# Patient Record
Sex: Female | Born: 1955 | Race: Black or African American | Hispanic: No | Marital: Married | Smoking: Never smoker
Health system: Southern US, Community
[De-identification: ages and names within clinical notes are randomized; demographics above are authoritative.]

## PROBLEM LIST (undated history)

## (undated) DIAGNOSIS — H269 Unspecified cataract: Secondary | ICD-10-CM

## (undated) HISTORY — DX: Unspecified cataract: H26.9

## (undated) HISTORY — PX: EYE SURGERY: SHX253

## (undated) HISTORY — PX: JOINT REPLACEMENT: SHX530

## (undated) HISTORY — PX: DILATION AND CURETTAGE OF UTERUS: SHX78

---

## 1997-06-06 HISTORY — PX: BREAST BIOPSY: SHX20

## 1999-03-09 ENCOUNTER — Other Ambulatory Visit: Admission: RE | Admit: 1999-03-09 | Discharge: 1999-03-09 | Payer: Self-pay | Admitting: *Deleted

## 2004-09-29 ENCOUNTER — Ambulatory Visit: Payer: Self-pay

## 2005-11-08 ENCOUNTER — Ambulatory Visit: Payer: Self-pay

## 2005-11-15 ENCOUNTER — Ambulatory Visit: Payer: Self-pay

## 2006-11-20 ENCOUNTER — Ambulatory Visit: Payer: Self-pay

## 2007-11-21 ENCOUNTER — Ambulatory Visit: Payer: Self-pay

## 2009-02-19 ENCOUNTER — Ambulatory Visit: Payer: Self-pay

## 2010-03-18 ENCOUNTER — Ambulatory Visit: Payer: Self-pay

## 2011-05-03 ENCOUNTER — Ambulatory Visit: Payer: Self-pay

## 2011-06-14 ENCOUNTER — Encounter: Payer: Self-pay | Admitting: Sports Medicine

## 2011-07-08 ENCOUNTER — Encounter: Payer: Self-pay | Admitting: Sports Medicine

## 2011-08-05 ENCOUNTER — Encounter: Payer: Self-pay | Admitting: Sports Medicine

## 2011-09-05 ENCOUNTER — Encounter: Payer: Self-pay | Admitting: Sports Medicine

## 2011-10-05 ENCOUNTER — Encounter: Payer: Self-pay | Admitting: Sports Medicine

## 2011-10-07 ENCOUNTER — Ambulatory Visit: Payer: Self-pay | Admitting: Sports Medicine

## 2016-04-04 ENCOUNTER — Other Ambulatory Visit: Payer: Self-pay | Admitting: Obstetrics and Gynecology

## 2016-05-05 ENCOUNTER — Other Ambulatory Visit: Payer: Self-pay | Admitting: Obstetrics and Gynecology

## 2016-05-05 DIAGNOSIS — Z1231 Encounter for screening mammogram for malignant neoplasm of breast: Secondary | ICD-10-CM

## 2016-05-17 ENCOUNTER — Ambulatory Visit: Payer: Self-pay

## 2017-02-16 ENCOUNTER — Ambulatory Visit (INDEPENDENT_AMBULATORY_CARE_PROVIDER_SITE_OTHER): Payer: 59 | Admitting: Family Medicine

## 2017-02-16 ENCOUNTER — Encounter: Payer: Self-pay | Admitting: Family Medicine

## 2017-02-16 VITALS — BP 106/66 | HR 84 | Temp 98.7°F | Ht 68.3 in | Wt 159.8 lb

## 2017-02-16 DIAGNOSIS — M25551 Pain in right hip: Secondary | ICD-10-CM

## 2017-02-16 DIAGNOSIS — Z7689 Persons encountering health services in other specified circumstances: Secondary | ICD-10-CM

## 2017-02-16 DIAGNOSIS — H269 Unspecified cataract: Secondary | ICD-10-CM

## 2017-02-16 MED ORDER — DICLOFENAC SODIUM 1 % TD GEL: 2.0000 g | Freq: Four times a day (QID) | TRANSDERMAL | 2 refills | Status: DC

## 2017-02-16 NOTE — Progress Notes (Signed)
BP 106/66   Pulse 84   Temp 98.7 F (37.1 C)   Ht 5' 8.3" (1.735 m)   Wt 159 lb 12.8 oz (72.5 kg)   LMP  (LMP Unknown)   SpO2 98%   BMI 24.08 kg/m    Subjective:    Patient ID: Yolanda Rodgers, female    DOB: August 02, 1955, 61 y.o.   MRN: [id number]  HPI: Yolanda Rodgers is a 61 y.o. female  Chief Complaint  Patient presents with  . Establish Care    pt states she has a nerve in her right hip that catches every now and then    Patient presents today to establish care. No known medical conditions other than cataracts, for which sh is followed by Opthalmology and will soon be having surgery for. No concerns today other than some right hip "catching" and pain on occasion. Occurs about once every week or so, usually lasts less than a day. No noticed pattern of causation. Uses muscle rubs, aleve, and tylenol with good relief.   Past Medical History:  Diagnosis Date  . Cataract    Social History   Social History  . Marital status: Married    Spouse name: N/A  . Number of children: N/A  . Years of education: N/A   Occupational History  . Not on file.   Social History Main Topics  . Smoking status: Never Smoker  . Smokeless tobacco: Never Used  . Alcohol use Yes     Comment: on occasion  . Drug use: No  . Sexual activity: No   Other Topics Concern  . Not on file   Social History Narrative  . No narrative on file    Relevant past medical, surgical, family and social history reviewed and updated as indicated. Interim medical history since our last visit reviewed. Allergies and medications reviewed and updated.  Review of Systems  Constitutional: Negative.   HENT: Negative.   Eyes: Positive for visual disturbance (cataracts).  Respiratory: Negative.   Cardiovascular: Negative.   Gastrointestinal: Negative.   Musculoskeletal: Positive for arthralgias.  Neurological: Negative.   Psychiatric/Behavioral: Negative.    Per HPI unless specifically indicated above       Objective:    BP 106/66   Pulse 84   Temp 98.7 F (37.1 C)   Ht 5' 8.3" (1.735 m)   Wt 159 lb 12.8 oz (72.5 kg)   LMP  (LMP Unknown)   SpO2 98%   BMI 24.08 kg/m   Wt Readings from Last 3 Encounters:  02/16/17 159 lb 12.8 oz (72.5 kg)    Physical Exam  Constitutional: She is oriented to person, place, and time. She appears well-developed and well-nourished. No distress.  Eyes: Conjunctivae are normal.  Neck: Normal range of motion. Neck supple.  Cardiovascular: Normal rate and normal heart sounds.   Pulmonary/Chest: Effort normal and breath sounds normal. No respiratory distress.  Musculoskeletal: Normal range of motion. She exhibits no edema, tenderness or deformity.  Neurological: She is alert and oriented to person, place, and time.  Skin: Skin is warm and dry.  Psychiatric: She has a normal mood and affect. Her behavior is normal.  Nursing note and vitals reviewed.  No results found for this or any previous visit.    Assessment & Plan:   Problem List Items Addressed This Visit      Other   Cataract    Pending surgical repair. Continue per Opthalmology       Other Visit Diagnoses  Encounter to establish care    -  Primary   Right hip pain       Suspect arthritic. Pt wishes to hold off on imaging for now. Recommended swimming and yoga. Diclofenac gel sent, continue tylenol and soaks prn      Follow up plan: Return for CPE.

## 2017-02-17 DIAGNOSIS — H269 Unspecified cataract: Secondary | ICD-10-CM | POA: Insufficient documentation

## 2017-02-17 NOTE — Assessment & Plan Note (Signed)
Pending surgical repair. Continue per Opthalmology

## 2017-02-17 NOTE — Patient Instructions (Signed)
Follow up as needed

## 2017-02-23 ENCOUNTER — Ambulatory Visit (INDEPENDENT_AMBULATORY_CARE_PROVIDER_SITE_OTHER): Payer: 59 | Admitting: Family Medicine

## 2017-02-23 ENCOUNTER — Encounter: Payer: Self-pay | Admitting: Family Medicine

## 2017-02-23 VITALS — BP 105/67 | HR 71 | Temp 98.5°F | Ht 68.3 in | Wt 155.0 lb

## 2017-02-23 DIAGNOSIS — Z0000 Encounter for general adult medical examination without abnormal findings: Secondary | ICD-10-CM

## 2017-02-23 DIAGNOSIS — Z23 Encounter for immunization: Secondary | ICD-10-CM

## 2017-02-23 DIAGNOSIS — Z1159 Encounter for screening for other viral diseases: Secondary | ICD-10-CM

## 2017-02-23 DIAGNOSIS — Z114 Encounter for screening for human immunodeficiency virus [HIV]: Secondary | ICD-10-CM | POA: Diagnosis not present

## 2017-02-23 LAB — UA/M W/RFLX CULTURE, ROUTINE
Bilirubin, UA: NEGATIVE
Glucose, UA: NEGATIVE
Ketones, UA: NEGATIVE
LEUKOCYTES UA: NEGATIVE
Nitrite, UA: NEGATIVE
PH UA: 6.5 (ref 5.0–7.5)
PROTEIN UA: NEGATIVE
RBC UA: NEGATIVE
Specific Gravity, UA: 1.015 (ref 1.005–1.030)
Urobilinogen, Ur: 0.2 mg/dL (ref 0.2–1.0)

## 2017-02-23 LAB — MICROSCOPIC EXAMINATION
Bacteria, UA: NONE SEEN
RBC, UA: NONE SEEN /hpf (ref 0–?)

## 2017-02-24 ENCOUNTER — Telehealth: Payer: Self-pay | Admitting: Family Medicine

## 2017-02-24 LAB — CBC WITH DIFFERENTIAL/PLATELET
BASOS ABS: 0.1 10*3/uL (ref 0.0–0.2)
Basos: 2 %
EOS (ABSOLUTE): 0.3 10*3/uL (ref 0.0–0.4)
EOS: 6 %
Hematocrit: 35.7 % (ref 34.0–46.6)
Hemoglobin: 11.8 g/dL (ref 11.1–15.9)
IMMATURE GRANULOCYTES: 0 %
Immature Grans (Abs): 0 10*3/uL (ref 0.0–0.1)
Lymphocytes Absolute: 2.3 10*3/uL (ref 0.7–3.1)
Lymphs: 51 %
MCH: 28.9 pg (ref 26.6–33.0)
MCHC: 33.1 g/dL (ref 31.5–35.7)
MCV: 87 fL (ref 79–97)
MONOS ABS: 0.3 10*3/uL (ref 0.1–0.9)
Monocytes: 6 %
NEUTROS PCT: 35 %
Neutrophils Absolute: 1.6 10*3/uL (ref 1.4–7.0)
PLATELETS: 350 10*3/uL (ref 150–379)
RBC: 4.09 x10E6/uL (ref 3.77–5.28)
RDW: 14.3 % (ref 12.3–15.4)
WBC: 4.4 10*3/uL (ref 3.4–10.8)

## 2017-02-24 LAB — COMPREHENSIVE METABOLIC PANEL
ALK PHOS: 57 IU/L (ref 39–117)
ALT: 10 IU/L (ref 0–32)
AST: 17 IU/L (ref 0–40)
Albumin/Globulin Ratio: 1.6 (ref 1.2–2.2)
Albumin: 4.6 g/dL (ref 3.5–5.5)
BUN/Creatinine Ratio: 17 (ref 9–23)
BUN: 12 mg/dL (ref 6–24)
Bilirubin Total: 0.5 mg/dL (ref 0.0–1.2)
CALCIUM: 9.8 mg/dL (ref 8.7–10.2)
CO2: 24 mmol/L (ref 20–29)
CREATININE: 0.72 mg/dL (ref 0.57–1.00)
Chloride: 104 mmol/L (ref 96–106)
GFR calc Af Amer: 106 mL/min/{1.73_m2} (ref >59)
GFR, EST NON AFRICAN AMERICAN: 92 mL/min/{1.73_m2} (ref >59)
GLUCOSE: 83 mg/dL (ref 65–99)
Globulin, Total: 2.9 g/dL (ref 1.5–4.5)
Potassium: 4 mmol/L (ref 3.5–5.2)
SODIUM: 141 mmol/L (ref 134–144)
Total Protein: 7.5 g/dL (ref 6.0–8.5)

## 2017-02-24 LAB — HEPATITIS C ANTIBODY: Hep C Virus Ab: <0.1 s/co ratio (ref 0.0–0.9)

## 2017-02-24 LAB — LIPID PANEL W/O CHOL/HDL RATIO
CHOLESTEROL TOTAL: 236 mg/dL — AB (ref 100–199)
HDL: 61 mg/dL (ref >39)
LDL CALC: 165 mg/dL — AB (ref 0–99)
TRIGLYCERIDES: 52 mg/dL (ref 0–149)
VLDL Cholesterol Cal: 10 mg/dL (ref 5–40)

## 2017-02-24 LAB — HIV ANTIBODY (ROUTINE TESTING W REFLEX): HIV Screen 4th Generation wRfx: NONREACTIVE

## 2017-02-24 LAB — TSH: TSH: 1.52 u[IU]/mL (ref 0.450–4.500)

## 2017-02-24 NOTE — Telephone Encounter (Signed)
Left message to call.

## 2017-02-24 NOTE — Telephone Encounter (Signed)
Please call pt and let her know all of her labs came back normal except her bad cholesterol was elevated. If she would like to start a statin, that is reasonable here but she can also try red yeast rice and/or fish oil supplements and recheck in 6 months - let me know! Either way, we will see her back in 6 months for recheck

## 2017-02-24 NOTE — Telephone Encounter (Signed)
Patient notified, she wants to try the supplements first.

## 2017-02-26 NOTE — Patient Instructions (Signed)
Follow up in 1 year.

## 2017-02-26 NOTE — Progress Notes (Signed)
BP 105/67   Pulse 71   Temp 98.5 F (36.9 C)   Ht 5' 8.3" (1.735 m)   Wt 155 lb (70.3 kg)   LMP  (LMP Unknown)   SpO2 100%   BMI 23.36 kg/m    Subjective:    Patient ID: Yolanda Rodgers, female    DOB: 1955/10/23, 61 y.o.   MRN: [id number]  HPI: Yolanda Rodgers is a 61 y.o. female presenting on 02/23/2017 for comprehensive medical examination. Current medical complaints include:none  Does not want a Colonoscopy, interested in a non-invasive type of screening at some point but not right now.  Menopausal Symptoms: no  Depression Screen done today and results listed below:  Depression screen [facility] 2/9 02/16/2017  Decreased Interest 1  Down, Depressed, Hopeless 2  PHQ - 2 Score 3  Altered sleeping 1  Tired, decreased energy 0  Change in appetite 0  Feeling bad or failure about yourself  1  Trouble concentrating 0  Moving slowly or fidgety/restless 0  Suicidal thoughts 0  PHQ-9 Score 5    The patient does not have a history of falls. I did not complete a risk assessment for falls. A plan of care for falls was not documented.   Past Medical History:  Past Medical History:  Diagnosis Date  . Cataract     Surgical History:  Past Surgical History:  Procedure Laterality Date  . BREAST BIOPSY Right 1999   negative per patient  . DILATION AND CURETTAGE OF UTERUS      Medications:  Current Outpatient Prescriptions on File Prior to Visit  Medication Sig  . Calcium Carbonate-Vitamin D (CALCIUM-VITAMIN D3 PO) Take 1 tablet by mouth daily.  . Cyanocobalamin (VITAMIN B12 PO) Take 1 tablet by mouth daily.  . diclofenac sodium (VOLTAREN) 1 % GEL Apply 2 g topically 4 (four) times daily.  . Glucosamine-Chondroitin (GLUCOSAMINE CHONDR COMPLEX PO) Take 1 tablet by mouth daily.  . Multiple Vitamin (MULTIVITAMIN) tablet Take 1 tablet by mouth daily.  . Nutritional Supplements (ESTROVEN PO) Take 1 tablet by mouth daily.   No current facility-administered medications on file prior to  visit.     Allergies:  No Known Allergies  Social History:  Social History   Social History  . Marital status: Married    Spouse name: N/A  . Number of children: N/A  . Years of education: N/A   Occupational History  . Not on file.   Social History Main Topics  . Smoking status: Never Smoker  . Smokeless tobacco: Never Used  . Alcohol use Yes     Comment: on occasion  . Drug use: No  . Sexual activity: No   Other Topics Concern  . Not on file   Social History Narrative  . No narrative on file   History  Smoking Status  . Never Smoker  Smokeless Tobacco  . Never Used   History  Alcohol Use  . Yes    Comment: on occasion    Family History:  Family History  Problem Relation Age of Onset  . Adopted: Yes    Past medical history, surgical history, medications, allergies, family history and social history reviewed with patient today and changes made to appropriate areas of the chart.   Review of Systems - General ROS: negative Psychological ROS: negative Ophthalmic ROS: positive for - blurred vision from known cataracts, pending surgery ENT ROS: negative Breast ROS: negative for breast lumps Respiratory ROS: no cough, shortness of breath, or wheezing Cardiovascular  ROS: no chest pain or dyspnea on exertion Gastrointestinal ROS: no abdominal pain, change in bowel habits, or black or bloody stools Genito-Urinary ROS: no dysuria, trouble voiding, or hematuria Musculoskeletal ROS: negative Neurological ROS: no TIA or stroke symptoms Dermatological ROS: negative All other ROS negative except what is listed above and in the HPI.      Objective:    BP 105/67   Pulse 71   Temp 98.5 F (36.9 C)   Ht 5' 8.3" (1.735 m)   Wt 155 lb (70.3 kg)   LMP  (LMP Unknown)   SpO2 100%   BMI 23.36 kg/m   Wt Readings from Last 3 Encounters:  02/23/17 155 lb (70.3 kg)  02/16/17 159 lb 12.8 oz (72.5 kg)    Physical Exam  Constitutional: She is oriented to person,  place, and time. She appears well-developed and well-nourished. No distress.  HENT:  Head: Atraumatic.  Right Ear: External ear normal.  Left Ear: External ear normal.  Nose: Nose normal.  Mouth/Throat: Oropharynx is clear and moist. No oropharyngeal exudate.  Eyes: Pupils are equal, round, and reactive to light. Conjunctivae are normal. No scleral icterus.  Neck: Normal range of motion. Neck supple. No thyromegaly present.  Cardiovascular: Normal rate, regular rhythm, normal heart sounds and intact distal pulses.   Pulmonary/Chest: Effort normal and breath sounds normal. No respiratory distress.  Breast exam declined  Abdominal: Soft. Bowel sounds are normal. She exhibits no mass. There is no tenderness.  Musculoskeletal: Normal range of motion. She exhibits no edema or tenderness.  Lymphadenopathy:    She has no cervical adenopathy.  Neurological: She is alert and oriented to person, place, and time. No cranial nerve deficit.  Skin: Skin is warm and dry. No rash noted.  Psychiatric: She has a normal mood and affect. Her behavior is normal.  Nursing note and vitals reviewed.     Assessment & Plan:   Problem List Items Addressed This Visit    None    Visit Diagnoses    Annual physical exam    -  Primary   Fasting labs today. Discussed colon cancer screening options, pt will let us know what she wants to do in the future.    Relevant Orders   CBC with Differential/Platelet (Completed)   Comprehensive metabolic panel (Completed)   Lipid Panel w/o Chol/HDL Ratio (Completed)   TSH (Completed)   UA/M w/rflx Culture, Routine (Completed)   Need for diphtheria-tetanus-pertussis (Tdap) vaccine       Relevant Orders   Tdap vaccine greater than or equal to 7yo IM (Completed)   Need for hepatitis C screening test       Relevant Orders   Hepatitis C Antibody (Completed)   Encounter for screening for HIV       Relevant Orders   HIV antibody (Completed)       Follow up plan: Return  in about 1 year (around 02/23/2018) for CPE, pending normal labs.   LABORATORY TESTING:  - Pap smear: up to date  IMMUNIZATIONS:   - Tdap: Tetanus vaccination status reviewed: Td vaccination indicated and given today. - Influenza: will get at her work soon  SCREENING: -Mammogram: Up to date  - Colonoscopy: Refused   PATIENT COUNSELING:   Advised to take 1 mg of folate supplement per day if capable of pregnancy.   Sexuality: Discussed sexually transmitted diseases, partner selection, use of condoms, avoidance of unintended pregnancy  and contraceptive alternatives.   Advised to avoid cigarette smoking.  I discussed with the patient that most people either abstain from alcohol or drink within safe limits (<=14/week and <=4 drinks/occasion for males, <=7/weeks and <= 3 drinks/occasion for females) and that the risk for alcohol disorders and other health effects rises proportionally with the number of drinks per week and how often a drinker exceeds daily limits.  Discussed cessation/primary prevention of drug use and availability of treatment for abuse.   Diet: Encouraged to adjust caloric intake to maintain  or achieve ideal body weight, to reduce intake of dietary saturated fat and total fat, to limit sodium intake by avoiding high sodium foods and not adding table salt, and to maintain adequate dietary potassium and calcium preferably from fresh fruits, vegetables, and low-fat dairy products.    stressed the importance of regular exercise  Injury prevention: Discussed safety belts, safety helmets, smoke detector, smoking near bedding or upholstery.   Dental health: Discussed importance of regular tooth brushing, flossing, and dental visits.    NEXT PREVENTATIVE PHYSICAL DUE IN 1 YEAR. Return in about 1 year (around 02/23/2018) for CPE, pending normal labs.

## 2017-05-17 ENCOUNTER — Ambulatory Visit: Payer: 59 | Admitting: Family Medicine

## 2017-05-17 ENCOUNTER — Ambulatory Visit: Payer: Self-pay | Admitting: Family Medicine

## 2017-05-19 ENCOUNTER — Ambulatory Visit: Payer: 59 | Admitting: Family Medicine

## 2017-05-19 ENCOUNTER — Encounter: Payer: Self-pay | Admitting: Family Medicine

## 2017-05-19 VITALS — BP 91/57 | HR 86 | Temp 98.1°F | Wt 161.7 lb

## 2017-05-19 DIAGNOSIS — Z1239 Encounter for other screening for malignant neoplasm of breast: Secondary | ICD-10-CM

## 2017-05-19 DIAGNOSIS — Z1231 Encounter for screening mammogram for malignant neoplasm of breast: Secondary | ICD-10-CM

## 2017-05-19 DIAGNOSIS — Z23 Encounter for immunization: Secondary | ICD-10-CM

## 2017-05-19 NOTE — Progress Notes (Signed)
BP (!) 91/57 (BP Location: Left Arm, Patient Position: Sitting, Cuff Size: Normal)   Pulse 86   Temp 98.1 F (36.7 C) (Oral)   Wt 161 lb 11.2 oz (73.3 kg)   SpO2 100%   BMI 24.37 kg/m    Subjective:    Patient ID: Yolanda Rodgers, female    DOB: [birth year], 61 y.o.   MRN: [id number]  HPI: Yolanda Rodgers is a 61 y.o. female  Chief Complaint  Patient presents with  . Mammogram    Wants order for Mammogram   Pt wanting to discuss some health maintenance items today. Wanting order for mammogram as her other one is about to expire and she would also like to have it done at a different location. Due this month.   Also wanting to get her pap smear while she's here if she can. [facility].   Still considering cologuard testing, adamant about no colonoscopy at this time. Not having any GI sxs and wondering how necessary testing is.   Relevant past medical, surgical, family and social history reviewed and updated as indicated. Interim medical history since our last visit reviewed. Allergies and medications reviewed and updated.  Review of Systems  Constitutional: Negative.   Respiratory: Negative.   Cardiovascular: Negative.   Gastrointestinal: Negative.   Musculoskeletal: Negative.   Neurological: Negative.   Psychiatric/Behavioral: Negative.    Per HPI unless specifically indicated above     Objective:    BP (!) 91/57 (BP Location: Left Arm, Patient Position: Sitting, Cuff Size: Normal)   Pulse 86   Temp 98.1 F (36.7 C) (Oral)   Wt 161 lb 11.2 oz (73.3 kg)   SpO2 100%   BMI 24.37 kg/m   Wt Readings from Last 3 Encounters:  05/19/17 161 lb 11.2 oz (73.3 kg)  02/23/17 155 lb (70.3 kg)  02/16/17 159 lb 12.8 oz (72.5 kg)    Physical Exam  Constitutional: She is oriented to person, place, and time. She appears well-developed and well-nourished.  HENT:  Head: Atraumatic.  Eyes: Conjunctivae are normal. Pupils are equal, round, and  reactive to light. No scleral icterus.  Neck: Normal range of motion. Neck supple.  Cardiovascular: Normal rate and normal heart sounds.  Pulmonary/Chest: Effort normal and breath sounds normal.  Musculoskeletal: Normal range of motion.  Neurological: She is alert and oriented to person, place, and time.  Skin: Skin is warm and dry.  Psychiatric: She has a normal mood and affect. Her behavior is normal.  Nursing note and vitals reviewed.   Results for orders placed or performed in visit on 02/23/17  Microscopic Examination  Result Value Ref Range   WBC, UA 0-5 0 - 5 /hpf   RBC, UA None seen 0 - 2 /hpf   Epithelial Cells (non renal) 0-10 0 - 10 /hpf   Bacteria, UA None seen None seen/Few  CBC with Differential/Platelet  Result Value Ref Range   WBC 4.4 3.4 - 10.8 x10E3/uL   RBC 4.09 3.77 - 5.28 x10E6/uL   Hemoglobin 11.8 11.1 - 15.9 g/dL   Hematocrit 01.035.7 27.234.0 - 46.6 %   MCV 87 79 - 97 fL   MCH 28.9 26.6 - 33.0 pg   MCHC 33.1 31.5 - 35.7 g/dL   RDW 53.614.3 64.412.3 - 03.415.4 %   Platelets 350 150 - 379 x10E3/uL   Neutrophils 35 Not Estab. %   Lymphs 51 Not Estab. %   Monocytes 6 Not Estab. %  Eos 6 Not Estab. %   Basos 2 Not Estab. %   Neutrophils Absolute 1.6 1.4 - 7.0 x10E3/uL   Lymphocytes Absolute 2.3 0.7 - 3.1 x10E3/uL   Monocytes Absolute 0.3 0.1 - 0.9 x10E3/uL   EOS (ABSOLUTE) 0.3 0.0 - 0.4 x10E3/uL   Basophils Absolute 0.1 0.0 - 0.2 x10E3/uL   Immature Granulocytes 0 Not Estab. %   Immature Grans (Abs) 0.0 0.0 - 0.1 x10E3/uL  Comprehensive metabolic panel  Result Value Ref Range   Glucose 83 65 - 99 mg/dL   BUN 12 6 - 24 mg/dL   Creatinine, Ser 7.820.72 0.57 - 1.00 mg/dL   GFR calc non Af Amer 92 >59 mL/min/1.73   GFR calc Af Amer 106 >59 mL/min/1.73   BUN/Creatinine Ratio 17 9 - 23   Sodium 141 134 - 144 mmol/L   Potassium 4.0 3.5 - 5.2 mmol/L   Chloride 104 96 - 106 mmol/L   CO2 24 20 - 29 mmol/L   Calcium 9.8 8.7 - 10.2 mg/dL   Total Protein 7.5 6.0 - 8.5 g/dL    Albumin 4.6 3.5 - 5.5 g/dL   Globulin, Total 2.9 1.5 - 4.5 g/dL   Albumin/Globulin Ratio 1.6 1.2 - 2.2   Bilirubin Total 0.5 0.0 - 1.2 mg/dL   Alkaline Phosphatase 57 39 - 117 IU/L   AST 17 0 - 40 IU/L   ALT 10 0 - 32 IU/L  Lipid Panel w/o Chol/HDL Ratio  Result Value Ref Range   Cholesterol, Total 236 (H) 100 - 199 mg/dL   Triglycerides 52 0 - 149 mg/dL   HDL 61 >95>39 mg/dL   VLDL Cholesterol Cal 10 5 - 40 mg/dL   LDL Calculated 621165 (H) 0 - 99 mg/dL  TSH  Result Value Ref Range   TSH 1.520 0.450 - 4.500 uIU/mL  UA/M w/rflx Culture, Routine  Result Value Ref Range   Specific Gravity, UA 1.015 1.005 - 1.030   pH, UA 6.5 5.0 - 7.5   Color, UA Yellow Yellow   Appearance Ur Clear Clear   Leukocytes, UA Negative Negative   Protein, UA Negative Negative/Trace   Glucose, UA Negative Negative   Ketones, UA Negative Negative   RBC, UA Negative Negative   Bilirubin, UA Negative Negative   Urobilinogen, Ur 0.2 0.2 - 1.0 mg/dL   Nitrite, UA Negative Negative   Microscopic Examination See below:   Hepatitis C Antibody  Result Value Ref Range   Hep C Virus Ab <0.1 0.0 - 0.9 s/co ratio  HIV antibody  Result Value Ref Range   HIV Screen 4th Generation wRfx Non Reactive Non Reactive      Assessment & Plan:   Problem List Items Addressed This Visit    None    Visit Diagnoses    Screening for breast cancer    -  Primary   [facility]. pt to call and schedule   Relevant Orders   MM DIGITAL SCREENING BILATERAL   Need for influenza vaccination       Relevant Orders   Flu Vaccine QUAD 6+ mos PF IM (Fluarix Quad PF) (Completed)    Discussed with patient that her pap won't be due for 4 more years as she had a normal result, and that her next pap could be her last pap if normal given age guidelines.  More information given regarding cologuard testing, questions answered and testing encouraged despite being asymptomatic for screening purposes.  Follow up plan: Return for CPE as scheduled.

## 2017-05-22 NOTE — Patient Instructions (Signed)
Follow up for CPE 

## 2017-05-31 LAB — HM MAMMOGRAPHY

## 2018-02-27 ENCOUNTER — Encounter: Payer: 59 | Admitting: Family Medicine

## 2018-03-07 ENCOUNTER — Encounter: Payer: Self-pay | Admitting: Family Medicine

## 2018-03-07 ENCOUNTER — Ambulatory Visit (INDEPENDENT_AMBULATORY_CARE_PROVIDER_SITE_OTHER): Payer: Managed Care, Other (non HMO) | Admitting: Family Medicine

## 2018-03-07 VITALS — BP 104/71 | HR 85 | Ht 68.0 in | Wt 169.5 lb

## 2018-03-07 DIAGNOSIS — Z0000 Encounter for general adult medical examination without abnormal findings: Secondary | ICD-10-CM

## 2018-03-07 DIAGNOSIS — Z1239 Encounter for other screening for malignant neoplasm of breast: Secondary | ICD-10-CM

## 2018-03-07 DIAGNOSIS — Z23 Encounter for immunization: Secondary | ICD-10-CM

## 2018-03-07 LAB — UA/M W/RFLX CULTURE, ROUTINE
Bilirubin, UA: NEGATIVE
GLUCOSE, UA: NEGATIVE
KETONES UA: NEGATIVE
LEUKOCYTES UA: NEGATIVE
Nitrite, UA: NEGATIVE
PROTEIN UA: NEGATIVE
SPEC GRAV UA: 1.02 (ref 1.005–1.030)
Urobilinogen, Ur: 0.2 mg/dL (ref 0.2–1.0)
pH, UA: 6.5 (ref 5.0–7.5)

## 2018-03-07 LAB — MICROSCOPIC EXAMINATION: Bacteria, UA: NONE SEEN

## 2018-03-07 NOTE — Progress Notes (Signed)
BP 104/71   Pulse 85   Ht 5\' 8"  (1.727 m)   Wt 169 lb 8 oz (76.9 kg)   SpO2 96%   BMI 25.77 kg/m    Subjective:    Patient ID: Yolanda Rodgers, female    DOB: Jan 27, 1956, 62 y.o.   MRN: [id number]  HPI: Yolanda Rodgers is a 62 y.o. female presenting on 03/07/2018 for comprehensive medical examination. Current medical complaints include:none  She currently lives with: Menopausal Symptoms: no  Depression Screen done today and results listed below:  Depression screen [facility] 2/9 03/07/2018 02/16/2017  Decreased Interest 0 1  Down, Depressed, Hopeless 1 2  PHQ - 2 Score 1 3  Altered sleeping 1 1  Tired, decreased energy 1 0  Change in appetite 0 0  Feeling bad or failure about yourself  0 1  Trouble concentrating 0 0  Moving slowly or fidgety/restless 0 0  Suicidal thoughts 0 0  PHQ-9 Score 3 5    The patient does not have a history of falls. I did not complete a risk assessment for falls. A plan of care for falls was not documented.   Past Medical History:  Past Medical History:  Diagnosis Date  . Cataract     Surgical History:  Past Surgical History:  Procedure Laterality Date  . BREAST BIOPSY Right 1999   negative per patient  . DILATION AND CURETTAGE OF UTERUS      Medications:  Current Outpatient Medications on File Prior to Visit  Medication Sig  . Calcium Carbonate-Vitamin D (CALCIUM-VITAMIN D3 PO) Take 1 tablet by mouth daily.  . Cyanocobalamin (VITAMIN B12 PO) Take 1 tablet by mouth daily.  . diclofenac sodium (VOLTAREN) 1 % GEL Apply 2 g topically 4 (four) times daily.  . Glucosamine-Chondroitin (GLUCOSAMINE CHONDR COMPLEX PO) Take 1 tablet by mouth daily.  . Multiple Vitamin (MULTIVITAMIN) tablet Take 1 tablet by mouth daily.  . Nutritional Supplements (ESTROVEN PO) Take 1 tablet by mouth daily.  . pilocarpine (PILOCAR) 1 % ophthalmic solution Apply to eye.   No current facility-administered medications on file prior to visit.     Allergies:  No Known  Allergies  Social History:  Social History   Socioeconomic History  . Marital status: Married    Spouse name: Not on file  . Number of children: Not on file  . Years of education: Not on file  . Highest education level: Not on file  Occupational History  . Not on file  Social Needs  . Financial resource strain: Not on file  . Food insecurity:    Worry: Not on file    Inability: Not on file  . Transportation needs:    Medical: Not on file    Non-medical: Not on file  Tobacco Use  . Smoking status: Never Smoker  . Smokeless tobacco: Never Used  Substance and Sexual Activity  . Alcohol use: Yes    Comment: on occasion  . Drug use: No  . Sexual activity: Never  Lifestyle  . Physical activity:    Days per week: Not on file    Minutes per session: Not on file  . Stress: Not on file  Relationships  . Social connections:    Talks on phone: Not on file    Gets together: Not on file    Attends religious service: Not on file    Active member of club or organization: Not on file    Attends meetings of clubs or organizations: Not on  file    Relationship status: Not on file  . Intimate partner violence:    Fear of current or ex partner: Not on file    Emotionally abused: Not on file    Physically abused: Not on file    Forced sexual activity: Not on file  Other Topics Concern  . Not on file  Social History Narrative  . Not on file   Social History   Tobacco Use  Smoking Status Never Smoker  Smokeless Tobacco Never Used   Social History   Substance and Sexual Activity  Alcohol Use Yes   Comment: on occasion    Family History:  Family History  Adopted: Yes    Past medical history, surgical history, medications, allergies, family history and social history reviewed with patient today and changes made to appropriate areas of the chart.   Review of Systems - General ROS: negative Psychological ROS: negative Ophthalmic ROS: negative ENT ROS: negative Allergy  and Immunology ROS: negative Hematological and Lymphatic ROS: negative Endocrine ROS: negative Breast ROS: negative for breast lumps Respiratory ROS: no cough, shortness of breath, or wheezing Cardiovascular ROS: no chest pain or dyspnea on exertion Gastrointestinal ROS: no abdominal pain, change in bowel habits, or black or bloody stools Genito-Urinary ROS: no dysuria, trouble voiding, or hematuria Musculoskeletal ROS: negative Neurological ROS: no TIA or stroke symptoms Dermatological ROS: negative All other ROS negative except what is listed above and in the HPI.      Objective:    BP 104/71   Pulse 85   Ht 5\' 8"  (1.727 m)   Wt 169 lb 8 oz (76.9 kg)   SpO2 96%   BMI 25.77 kg/m   Wt Readings from Last 3 Encounters:  03/07/18 169 lb 8 oz (76.9 kg)  05/19/17 161 lb 11.2 oz (73.3 kg)  02/23/17 155 lb (70.3 kg)    Physical Exam  Constitutional: She is oriented to person, place, and time. She appears well-developed and well-nourished. No distress.  HENT:  Head: Atraumatic.  Right Ear: External ear normal.  Left Ear: External ear normal.  Nose: Nose normal.  Mouth/Throat: Oropharynx is clear and moist. No oropharyngeal exudate.  Eyes: Pupils are equal, round, and reactive to light. Conjunctivae are normal. No scleral icterus.  Neck: Normal range of motion. Neck supple. No thyromegaly present.  Cardiovascular: Normal rate, regular rhythm, normal heart sounds and intact distal pulses.  Pulmonary/Chest: Effort normal and breath sounds normal. No respiratory distress. Right breast exhibits no mass, no skin change and no tenderness. Left breast exhibits no mass, no skin change and no tenderness.  Abdominal: Soft. Bowel sounds are normal. She exhibits no mass. There is no tenderness.  Genitourinary:  Genitourinary Comments: Exam declined today with shared decision making  Musculoskeletal: Normal range of motion. She exhibits no edema or tenderness.  Lymphadenopathy:    She has no  cervical adenopathy.  Neurological: She is alert and oriented to person, place, and time. No cranial nerve deficit.  Skin: Skin is warm and dry. No rash noted.  Psychiatric: She has a normal mood and affect. Her behavior is normal.  Nursing note and vitals reviewed.     Assessment & Plan:   Problem List Items Addressed This Visit    None    Visit Diagnoses    Annual physical exam    -  Primary   Relevant Orders   CBC with Differential/Platelet   Comprehensive metabolic panel   Lipid Panel w/o Chol/HDL Ratio   TSH  UA/M w/rflx Culture, Routine   Screening for breast cancer       Relevant Orders   MM DIGITAL SCREENING BILATERAL       Follow up plan: Return in about 1 year (around 03/08/2019) for CPE.   LABORATORY TESTING:  - Pap smear: up to date  IMMUNIZATIONS:   - Tdap: Tetanus vaccination status reviewed: last tetanus booster within 10 years. - Influenza: Administered today  SCREENING: -Mammogram: Ordered today  - Colonoscopy: Up to date   PATIENT COUNSELING:   Advised to take 1 mg of folate supplement per day if capable of pregnancy.   Sexuality: Discussed sexually transmitted diseases, partner selection, use of condoms, avoidance of unintended pregnancy  and contraceptive alternatives.   Advised to avoid cigarette smoking.  I discussed with the patient that most people either abstain from alcohol or drink within safe limits (<=14/week and <=4 drinks/occasion for males, <=7/weeks and <= 3 drinks/occasion for females) and that the risk for alcohol disorders and other health effects rises proportionally with the number of drinks per week and how often a drinker exceeds daily limits.  Discussed cessation/primary prevention of drug use and availability of treatment for abuse.   Diet: Encouraged to adjust caloric intake to maintain  or achieve ideal body weight, to reduce intake of dietary saturated fat and total fat, to limit sodium intake by avoiding high sodium  foods and not adding table salt, and to maintain adequate dietary potassium and calcium preferably from fresh fruits, vegetables, and low-fat dairy products.    stressed the importance of regular exercise  Injury prevention: Discussed safety belts, safety helmets, smoke detector, smoking near bedding or upholstery.   Dental health: Discussed importance of regular tooth brushing, flossing, and dental visits.    NEXT PREVENTATIVE PHYSICAL DUE IN 1 YEAR. Return in about 1 year (around 03/08/2019) for CPE.

## 2018-03-07 NOTE — Patient Instructions (Addendum)
Follow up for annual exam in 1 yearInfluenza (Flu) Vaccine (Inactivated or Recombinant): What You Need to Know 1. Why get vaccinated? Influenza ("flu") is a contagious disease that spreads around the [place] every year, usually between October and May. Flu is caused by influenza viruses, and is spread mainly by coughing, sneezing, and close contact. Anyone can get flu. Flu strikes suddenly and can last several days. Symptoms vary by age, but can include:  fever/chills  sore throat  muscle aches  fatigue  cough  headache  runny or stuffy nose  Flu can also lead to pneumonia and blood infections, and cause diarrhea and seizures in children. If you have a medical condition, such as heart or lung disease, flu can make it worse. Flu is more dangerous for some people. Infants and young children, people 11 years of age and older, pregnant women, and people with certain health conditions or a weakened immune system are at greatest risk. Each year thousands of people in the [place] die from flu, and many more are hospitalized. Flu vaccine can:  keep you from getting flu,  make flu less severe if you do get it, and  keep you from spreading flu to your family and other people. 2. Inactivated and recombinant flu vaccines A dose of flu vaccine is recommended every flu season. Children 6 months through 11 years of age may need two doses during the same flu season. Everyone else needs only one dose each flu season. Some inactivated flu vaccines contain a very small amount of a mercury-based preservative called thimerosal. Studies have not shown thimerosal in vaccines to be harmful, but flu vaccines that do not contain thimerosal are available. There is no live flu virus in flu shots. They cannot cause the flu. There are many flu viruses, and they are always changing. Each year a new flu vaccine is made to protect against three or four viruses that are likely to cause disease in the  upcoming flu season. But even when the vaccine doesn't exactly match these viruses, it may still provide some protection. Flu vaccine cannot prevent:  flu that is caused by a virus not covered by the vaccine, or  illnesses that look like flu but are not.  It takes about 2 weeks for protection to develop after vaccination, and protection lasts through the flu season. 3. Some people should not get this vaccine Tell the person who is giving you the vaccine:  If you have any severe, life-threatening allergies. If you ever had a life-threatening allergic reaction after a dose of flu vaccine, or have a severe allergy to any part of this vaccine, you may be advised not to get vaccinated. Most, but not all, types of flu vaccine contain a small amount of egg protein.  If you ever had Guillain-Barr Syndrome (also called GBS). Some people with a history of GBS should not get this vaccine. This should be discussed with your doctor.  If you are not feeling well. It is usually okay to get flu vaccine when you have a mild illness, but you might be asked to come back when you feel better.  4. Risks of a vaccine reaction With any medicine, including vaccines, there is a chance of reactions. These are usually mild and go away on their own, but serious reactions are also possible. Most people who get a flu shot do not have any problems with it. Minor problems following a flu shot include:  soreness, redness, or swelling where the  shot was given  hoarseness  sore, red or itchy eyes  cough  fever  aches  headache  itching  fatigue  If these problems occur, they usually begin soon after the shot and last 1 or 2 days. More serious problems following a flu shot can include the following:  There may be a small increased risk of Guillain-Barre Syndrome (GBS) after inactivated flu vaccine. This risk has been estimated at 1 or 2 additional cases per million people vaccinated. This is much lower than  the risk of severe complications from flu, which can be prevented by flu vaccine.  Young children who get the flu shot along with pneumococcal vaccine (PCV13) and/or DTaP vaccine at the same time might be slightly more likely to have a seizure caused by fever. Ask your doctor for more information. Tell your doctor if a child who is getting flu vaccine has ever had a seizure.  Problems that could happen after any injected vaccine:  People sometimes faint after a medical procedure, including vaccination. Sitting or lying down for about 15 minutes can help prevent fainting, and injuries caused by a fall. Tell your doctor if you feel dizzy, or have vision changes or ringing in the ears.  Some people get severe pain in the shoulder and have difficulty moving the arm where a shot was given. This happens very rarely.  Any medication can cause a severe allergic reaction. Such reactions from a vaccine are very rare, estimated at about 1 in a million doses, and would happen within a few minutes to a few hours after the vaccination. As with any medicine, there is a very remote chance of a vaccine causing a serious injury or death. The safety of vaccines is always being monitored. For more information, visit: http://floyd.org/ 5. What if there is a serious reaction? What should I look for? Look for anything that concerns you, such as signs of a severe allergic reaction, very high fever, or unusual behavior. Signs of a severe allergic reaction can include hives, swelling of the face and throat, difficulty breathing, a fast heartbeat, dizziness, and weakness. These would start a few minutes to a few hours after the vaccination. What should I do?  If you think it is a severe allergic reaction or other emergency that can't wait, call 9-1-1 and get the person to the nearest hospital. Otherwise, call your doctor.  Reactions should be reported to the Vaccine Adverse Event Reporting System (VAERS). Your  doctor should file this report, or you can do it yourself through the VAERS web site at www.vaers.LAgents.no, or by calling 1-(339) 026-1235. ? VAERS does not give medical advice. 6. The National Vaccine Injury Compensation Program The [employer] Vaccine Injury Compensation Program (VICP) is a federal program that was created to compensate people who may have been injured by certain vaccines. Persons who believe they may have been injured by a vaccine can learn about the program and about filing a claim by calling 1-805 148 1834 or visiting the VICP website at SpiritualWord.at. There is a time limit to file a claim for compensation. 7. How can I learn more?  Ask your healthcare provider. He or she can give you the vaccine package insert or suggest other sources of information.  Call your local or state health department.  Contact the Centers for Disease Control and Prevention (CDC): ? Call (671)731-2440 (1-800-CDC-INFO) or ? Visit CDC's website at BiotechRoom.com.cy Vaccine Information Statement, Inactivated Influenza Vaccine (01/10/2014) This information is not intended to replace advice given to you  by your health care provider. Make sure you discuss any questions you have with your health care provider. Document Released: 03/17/2006 Document Revised: 02/11/2016 Document Reviewed: 02/11/2016 Elsevier Interactive Patient Education  2017 [employer].

## 2018-03-07 NOTE — Addendum Note (Signed)
Addended by: Sheilah Mins A on: 03/07/2018 11:23 AM   Modules accepted: Orders

## 2018-03-08 LAB — CBC WITH DIFFERENTIAL/PLATELET
BASOS ABS: 0.1 10*3/uL (ref 0.0–0.2)
Basos: 2 %
EOS (ABSOLUTE): 0.2 10*3/uL (ref 0.0–0.4)
Eos: 5 %
Hematocrit: 36.6 % (ref 34.0–46.6)
Hemoglobin: 11.8 g/dL (ref 11.1–15.9)
Immature Grans (Abs): 0 10*3/uL (ref 0.0–0.1)
Immature Granulocytes: 0 %
LYMPHS ABS: 2 10*3/uL (ref 0.7–3.1)
Lymphs: 42 %
MCH: 28.2 pg (ref 26.6–33.0)
MCHC: 32.2 g/dL (ref 31.5–35.7)
MCV: 88 fL (ref 79–97)
MONOS ABS: 0.4 10*3/uL (ref 0.1–0.9)
Monocytes: 8 %
NEUTROS ABS: 2.1 10*3/uL (ref 1.4–7.0)
Neutrophils: 43 %
PLATELETS: 340 10*3/uL (ref 150–450)
RBC: 4.18 x10E6/uL (ref 3.77–5.28)
RDW: 13.1 % (ref 12.3–15.4)
WBC: 4.8 10*3/uL (ref 3.4–10.8)

## 2018-03-08 LAB — COMPREHENSIVE METABOLIC PANEL
A/G RATIO: 1.6 (ref 1.2–2.2)
ALK PHOS: 51 IU/L (ref 39–117)
ALT: 14 IU/L (ref 0–32)
AST: 15 IU/L (ref 0–40)
Albumin: 4.4 g/dL (ref 3.6–4.8)
BUN/Creatinine Ratio: 25 (ref 12–28)
BUN: 19 mg/dL (ref 8–27)
Bilirubin Total: 0.4 mg/dL (ref 0.0–1.2)
CHLORIDE: 102 mmol/L (ref 96–106)
CO2: 27 mmol/L (ref 20–29)
Calcium: 9.7 mg/dL (ref 8.7–10.3)
Creatinine, Ser: 0.77 mg/dL (ref 0.57–1.00)
GFR calc Af Amer: 97 mL/min/{1.73_m2} (ref >59)
GFR calc non Af Amer: 84 mL/min/{1.73_m2} (ref >59)
GLUCOSE: 71 mg/dL (ref 65–99)
Globulin, Total: 2.7 g/dL (ref 1.5–4.5)
POTASSIUM: 4.1 mmol/L (ref 3.5–5.2)
Sodium: 141 mmol/L (ref 134–144)
Total Protein: 7.1 g/dL (ref 6.0–8.5)

## 2018-03-08 LAB — LIPID PANEL W/O CHOL/HDL RATIO
CHOLESTEROL TOTAL: 247 mg/dL — AB (ref 100–199)
HDL: 70 mg/dL (ref >39)
LDL Calculated: 157 mg/dL — ABNORMAL HIGH (ref 0–99)
Triglycerides: 102 mg/dL (ref 0–149)
VLDL CHOLESTEROL CAL: 20 mg/dL (ref 5–40)

## 2018-03-08 LAB — TSH: TSH: 1.41 u[IU]/mL (ref 0.450–4.500)

## 2018-11-23 ENCOUNTER — Encounter: Payer: Self-pay | Admitting: Family Medicine

## 2018-11-23 ENCOUNTER — Ambulatory Visit (INDEPENDENT_AMBULATORY_CARE_PROVIDER_SITE_OTHER): Payer: Managed Care, Other (non HMO) | Admitting: Family Medicine

## 2018-11-23 ENCOUNTER — Other Ambulatory Visit: Payer: Self-pay

## 2018-11-23 VITALS — BP 122/71 | HR 81 | Temp 99.0°F | Ht 68.0 in | Wt 167.0 lb

## 2018-11-23 DIAGNOSIS — Z780 Asymptomatic menopausal state: Secondary | ICD-10-CM | POA: Diagnosis not present

## 2018-11-23 DIAGNOSIS — Z113 Encounter for screening for infections with a predominantly sexual mode of transmission: Secondary | ICD-10-CM

## 2018-11-23 DIAGNOSIS — N898 Other specified noninflammatory disorders of vagina: Secondary | ICD-10-CM | POA: Diagnosis not present

## 2018-11-23 LAB — UA/M W/RFLX CULTURE, ROUTINE
Bilirubin, UA: NEGATIVE
Glucose, UA: NEGATIVE
Ketones, UA: NEGATIVE
Leukocytes,UA: NEGATIVE
Nitrite, UA: NEGATIVE
Protein,UA: NEGATIVE
Specific Gravity, UA: 1.015 (ref 1.005–1.030)
Urobilinogen, Ur: 0.2 mg/dL (ref 0.2–1.0)
pH, UA: 7 (ref 5.0–7.5)

## 2018-11-23 LAB — WET PREP FOR TRICH, YEAST, CLUE
Clue Cell Exam: NEGATIVE
Trichomonas Exam: NEGATIVE
Yeast Exam: NEGATIVE

## 2018-11-23 MED ORDER — ESTRADIOL 0.1 MG/GM VA CREA: 1.0000 | TOPICAL_CREAM | VAGINAL | 12 refills | Status: DC

## 2018-11-23 NOTE — Progress Notes (Signed)
BP 122/71   Pulse 81   Temp 99 F (37.2 C) (Oral)   Ht 5\' 8"  (1.727 m)   Wt 167 lb (75.8 kg)   LMP  (LMP Unknown)   SpO2 98%   BMI 25.39 kg/m    Subjective:    Patient ID: Yolanda Rodgers, female    DOB: [birth year], 63 y.o.   MRN: [id number]  HPI: Yolanda Rodgers is a 63 y.o. female  Chief Complaint  Patient presents with  . Abdominal Pain    lower abdomen since last Saturday  . Vaginal discomfort   Lower abdominal aching and cramping x about a week. Notes sxs started after having intercourse for the first time in about 7 years. Had issues with dryness during this encounter as well as uncomfortable tightness. Denies bleeding, discharge, odor, dysuria, hematuria, fevers, rashes. Has not tried anything OTC. Is also concerned about if it would be possible for her to get pregnant 10 years post-menopause as she's seen cases online.   Relevant past medical, surgical, family and social history reviewed and updated as indicated. Interim medical history since our last visit reviewed. Allergies and medications reviewed and updated.  Review of Systems  Per HPI unless specifically indicated above     Objective:    BP 122/71   Pulse 81   Temp 99 F (37.2 C) (Oral)   Ht 5\' 8"  (1.727 m)   Wt 167 lb (75.8 kg)   LMP  (LMP Unknown)   SpO2 98%   BMI 25.39 kg/m   Wt Readings from Last 3 Encounters:  11/23/18 167 lb (75.8 kg)  03/07/18 169 lb 8 oz (76.9 kg)  05/19/17 161 lb 11.2 oz (73.3 kg)    Physical Exam Vitals signs and nursing note reviewed.  Constitutional:      Appearance: Normal appearance. She is not ill-appearing.  HENT:     Head: Atraumatic.  Eyes:     Extraocular Movements: Extraocular movements intact.     Conjunctiva/sclera: Conjunctivae normal.  Neck:     Musculoskeletal: Normal range of motion and neck supple.  Cardiovascular:     Rate and Rhythm: Normal rate and regular rhythm.     Heart sounds: Normal heart sounds.  Pulmonary:     Effort: Pulmonary  effort is normal.     Breath sounds: Normal breath sounds.  Abdominal:     General: Bowel sounds are normal.     Palpations: Abdomen is soft.     Tenderness: There is no abdominal tenderness. There is no right CVA tenderness, left CVA tenderness or guarding.  Genitourinary:    Comments: Declined GU exam at this time Musculoskeletal: Normal range of motion.  Skin:    General: Skin is warm and dry.  Neurological:     Mental Status: She is alert and oriented to person, place, and time.  Psychiatric:        Mood and Affect: Mood normal.        Thought Content: Thought content normal.        Judgment: Judgment normal.     Results for orders placed or performed in visit on 03/07/18  Microscopic Examination   URINE  Result Value Ref Range   WBC, UA 0-5 0 - 5 /hpf   RBC, UA 0-2 0 - 2 /hpf   Epithelial Cells (non renal) 0-10 0 - 10 /hpf   Bacteria, UA None seen None seen/Few  CBC with Differential/Platelet  Result Value Ref Range   WBC 4.8 3.4 -  10.8 x10E3/uL   RBC 4.18 3.77 - 5.28 x10E6/uL   Hemoglobin 11.8 11.1 - 15.9 g/dL   Hematocrit 40.936.6 81.134.0 - 46.6 %   MCV 88 79 - 97 fL   MCH 28.2 26.6 - 33.0 pg   MCHC 32.2 31.5 - 35.7 g/dL   RDW 91.413.1 78.212.3 - 95.615.4 %   Platelets 340 150 - 450 x10E3/uL   Neutrophils 43 Not Estab. %   Lymphs 42 Not Estab. %   Monocytes 8 Not Estab. %   Eos 5 Not Estab. %   Basos 2 Not Estab. %   Neutrophils Absolute 2.1 1.4 - 7.0 x10E3/uL   Lymphocytes Absolute 2.0 0.7 - 3.1 x10E3/uL   Monocytes Absolute 0.4 0.1 - 0.9 x10E3/uL   EOS (ABSOLUTE) 0.2 0.0 - 0.4 x10E3/uL   Basophils Absolute 0.1 0.0 - 0.2 x10E3/uL   Immature Granulocytes 0 Not Estab. %   Immature Grans (Abs) 0.0 0.0 - 0.1 x10E3/uL  Comprehensive metabolic panel  Result Value Ref Range   Glucose 71 65 - 99 mg/dL   BUN 19 8 - 27 mg/dL   Creatinine, Ser 2.130.77 0.57 - 1.00 mg/dL   GFR calc non Af Amer 84 >59 mL/min/1.73   GFR calc Af Amer 97 >59 mL/min/1.73   BUN/Creatinine Ratio 25 12 - 28    Sodium 141 134 - 144 mmol/L   Potassium 4.1 3.5 - 5.2 mmol/L   Chloride 102 96 - 106 mmol/L   CO2 27 20 - 29 mmol/L   Calcium 9.7 8.7 - 10.3 mg/dL   Total Protein 7.1 6.0 - 8.5 g/dL   Albumin 4.4 3.6 - 4.8 g/dL   Globulin, Total 2.7 1.5 - 4.5 g/dL   Albumin/Globulin Ratio 1.6 1.2 - 2.2   Bilirubin Total 0.4 0.0 - 1.2 mg/dL   Alkaline Phosphatase 51 39 - 117 IU/L   AST 15 0 - 40 IU/L   ALT 14 0 - 32 IU/L  Lipid Panel w/o Chol/HDL Ratio  Result Value Ref Range   Cholesterol, Total 247 (H) 100 - 199 mg/dL   Triglycerides 086102 0 - 149 mg/dL   HDL 70 >57>39 mg/dL   VLDL Cholesterol Cal 20 5 - 40 mg/dL   LDL Calculated 846157 (H) 0 - 99 mg/dL  TSH  Result Value Ref Range   TSH 1.410 0.450 - 4.500 uIU/mL  UA/M w/rflx Culture, Routine   Specimen: Urine   URINE  Result Value Ref Range   Specific Gravity, UA 1.020 1.005 - 1.030   pH, UA 6.5 5.0 - 7.5   Color, UA Yellow Yellow   Appearance Ur Clear Clear   Leukocytes, UA Negative Negative   Protein, UA Negative Negative/Trace   Glucose, UA Negative Negative   Ketones, UA Negative Negative   RBC, UA Trace (A) Negative   Bilirubin, UA Negative Negative   Urobilinogen, Ur 0.2 0.2 - 1.0 mg/dL   Nitrite, UA Negative Negative   Microscopic Examination See below:       Assessment & Plan:   Problem List Items Addressed This Visit    None    Visit Diagnoses    Vaginal irritation    -  Primary   U/A and wet prep normal. Suspect from post-menopausal atrophy. Estrace cream sent, use lubrication with intercourse   Relevant Orders   UA/M w/rflx Culture, Routine   WET PREP FOR TRICH, YEAST, CLUE   Menopause       Will check levels to reassure her about chance of fertility  Relevant Orders   FSH   Estrogens, Total   Routine screening for STI (sexually transmitted infection)       Relevant Orders   HIV Antibody (routine testing w rflx)   HSV(herpes simplex vrs) 1+2 ab-IgG   RPR   GC/Chlamydia Probe Amp       Follow up plan: Return  in about 4 months (around 03/25/2019) for CPE.

## 2018-11-25 LAB — HSV(HERPES SIMPLEX VRS) I + II AB-IGG

## 2018-11-26 LAB — GC/CHLAMYDIA PROBE AMP
Chlamydia trachomatis, NAA: NEGATIVE
Neisseria Gonorrhoeae by PCR: NEGATIVE

## 2018-11-27 LAB — HSV-2 IGG SUPPLEMENTAL TEST: HSV-2 IgG Supplemental Test: POSITIVE — AB

## 2018-11-27 LAB — FOLLICLE STIMULATING HORMONE: FSH: 83.2 m[IU]/mL

## 2018-11-27 LAB — HSV(HERPES SIMPLEX VRS) I + II AB-IGG
HSV 1 Glycoprotein G Ab, IgG: 61.8 index — ABNORMAL HIGH (ref 0.00–0.90)
HSV 2 IgG, Type Spec: 1 index — ABNORMAL HIGH (ref 0.00–0.90)

## 2018-11-27 LAB — HIV ANTIBODY (ROUTINE TESTING W REFLEX): HIV Screen 4th Generation wRfx: NONREACTIVE

## 2018-11-27 LAB — ESTROGENS, TOTAL: Estrogen: 111 pg/mL

## 2018-11-27 LAB — RPR: RPR Ser Ql: NONREACTIVE

## 2018-11-28 ENCOUNTER — Telehealth: Payer: Self-pay | Admitting: Family Medicine

## 2018-11-28 NOTE — Telephone Encounter (Signed)
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 She has remained asymptomatic and knows to contact us if ever developing any sores

## 2018-11-28 NOTE — Telephone Encounter (Signed)
-----   Message from Jerene Pitch, [place] sent at 11/27/2018  2:48 PM EDT ----- Please contact patient to discuss HSV

## 2019-03-15 ENCOUNTER — Ambulatory Visit (INDEPENDENT_AMBULATORY_CARE_PROVIDER_SITE_OTHER): Payer: Managed Care, Other (non HMO) | Admitting: Family Medicine

## 2019-03-15 ENCOUNTER — Encounter: Payer: Self-pay | Admitting: Family Medicine

## 2019-03-15 ENCOUNTER — Other Ambulatory Visit: Payer: Self-pay

## 2019-03-15 VITALS — BP 107/69 | HR 71 | Temp 99.5°F | Ht 68.5 in | Wt 166.0 lb

## 2019-03-15 DIAGNOSIS — Z0000 Encounter for general adult medical examination without abnormal findings: Secondary | ICD-10-CM

## 2019-03-15 DIAGNOSIS — M25551 Pain in right hip: Secondary | ICD-10-CM | POA: Diagnosis not present

## 2019-03-15 DIAGNOSIS — Z1231 Encounter for screening mammogram for malignant neoplasm of breast: Secondary | ICD-10-CM | POA: Diagnosis not present

## 2019-03-15 LAB — UA/M W/RFLX CULTURE, ROUTINE
Bilirubin, UA: NEGATIVE
Glucose, UA: NEGATIVE
Ketones, UA: NEGATIVE
Leukocytes,UA: NEGATIVE
Nitrite, UA: NEGATIVE
Protein,UA: NEGATIVE
RBC, UA: NEGATIVE
Specific Gravity, UA: 1.02 (ref 1.005–1.030)
Urobilinogen, Ur: 0.2 mg/dL (ref 0.2–1.0)
pH, UA: 5 (ref 5.0–7.5)

## 2019-03-15 NOTE — Progress Notes (Signed)
BP 107/69   Pulse 71   Temp 99.5 F (37.5 C) (Oral)   Ht 5' 8.5" (1.74 m)   Wt 166 lb (75.3 kg)   LMP  (LMP Unknown)   SpO2 99%   BMI 24.87 kg/m    Subjective:    Patient ID: Yolanda Rodgers, female    DOB: 09-19-1955, 63 y.o.   MRN: [id number]  HPI: Yolanda Rodgers is a 63 y.o. female presenting on 03/15/2019 for comprehensive medical examination. Current medical complaints include:see below  Favors her right leg due to right hip pain that is chronic for her and sometimes some pain down to knee. Feels her ROM has become limited due to this. Denies injury, swelling, numbness, tingling, stabbing pains. Not trying anything for this. Notes sitting all day at work seems to make things worse. 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 Uses diclofenac gel occasionally which does seem to help.  She currently lives with: Menopausal Symptoms: no  Depression Screen done today and results listed below:  Depression screen [facility] 2/9 03/15/2019 03/07/2018 02/16/2017  Decreased Interest 0 0 1  Down, Depressed, Hopeless 0 1 2  PHQ - 2 Score 0 1 3  Altered sleeping - 1 1  Tired, decreased energy - 1 0  Change in appetite - 0 0  Feeling bad or failure about yourself  - 0 1  Trouble concentrating - 0 0  Moving slowly or fidgety/restless - 0 0  Suicidal thoughts - 0 0  PHQ-9 Score - 3 5    The patient does not have a history of falls. I did not complete a risk assessment for falls. A plan of care for falls was not documented.   Past Medical History:  Past Medical History:  Diagnosis Date  . Cataract     Surgical History:  Past Surgical History:  Procedure Laterality Date  . BREAST BIOPSY Right 1999   negative per patient  . DILATION AND CURETTAGE OF UTERUS      Medications:  Current Outpatient Medications on File Prior to Visit  Medication Sig  . Calcium Carbonate-Vitamin D (CALCIUM-VITAMIN D3 PO) Take 1 tablet by mouth  daily.  . Cyanocobalamin (VITAMIN B12 PO) Take 1 tablet by mouth daily.  . diclofenac sodium (VOLTAREN) 1 % GEL Apply 2 g topically 4 (four) times daily.  Marland Kitchen estradiol (ESTRACE VAGINAL) 0.1 MG/GM vaginal cream Place 1 Applicatorful vaginally 3 (three) times a week.  . Multiple Vitamin (MULTIVITAMIN) tablet Take 1 tablet by mouth daily.  . Multiple Vitamins-Minerals (ZINC PO) Take by mouth. Pt unsure of dose  . Nutritional Supplements (ESTROVEN PO) Take 1 tablet by mouth daily.  . Glucosamine-Chondroitin (GLUCOSAMINE CHONDR COMPLEX PO) Take 1 tablet by mouth daily.   No current facility-administered medications on file prior to visit.     Allergies:  No Known Allergies  Social History:  Social History   Socioeconomic History  . Marital status: Married    Spouse name: Not on file  . Number of children: Not on file  . Years of education: Not on file  . Highest education level: Not on file  Occupational History  . Not on file  Social Needs  . Financial resource strain: Not on file  . Food insecurity    Worry: Not on file    Inability: Not on file  . Transportation needs    Medical: Not on file    Non-medical: Not on  file  Tobacco Use  . Smoking status: Never Smoker  . Smokeless tobacco: Never Used  Substance and Sexual Activity  . Alcohol use: Yes    Comment: on occasion  . Drug use: No  . Sexual activity: Never  Lifestyle  . Physical activity    Days per week: Not on file    Minutes per session: Not on file  . Stress: Not on file  Relationships  . Social Musician on phone: Not on file    Gets together: Not on file    Attends religious service: Not on file    Active member of club or organization: Not on file    Attends meetings of clubs or organizations: Not on file    Relationship status: Not on file  . Intimate partner violence    Fear of current or ex partner: Not on file    Emotionally abused: Not on file    Physically abused: Not on file     Forced sexual activity: Not on file  Other Topics Concern  . Not on file  Social History Narrative  . Not on file   Social History   Tobacco Use  Smoking Status Never Smoker  Smokeless Tobacco Never Used   Social History   Substance and Sexual Activity  Alcohol Use Yes   Comment: on occasion    Family History:  Family History  Adopted: Yes    Past medical history, surgical history, medications, allergies, family history and social history reviewed with patient today and changes made to appropriate areas of the chart.   Review of Systems - General ROS: negative Psychological ROS: negative Ophthalmic ROS: negative ENT ROS: negative Allergy and Immunology ROS: negative Hematological and Lymphatic ROS: negative Endocrine ROS: negative Breast ROS: negative for breast lumps Respiratory ROS: no cough, shortness of breath, or wheezing Cardiovascular ROS: no chest pain or dyspnea on exertion Gastrointestinal ROS: no abdominal pain, change in bowel habits, or black or bloody stools Genito-Urinary ROS: no dysuria, trouble voiding, or hematuria Musculoskeletal ROS: positive for - joint pain Neurological ROS: no TIA or stroke symptoms Dermatological ROS: negative All other ROS negative except what is listed above and in the HPI.      Objective:    BP 107/69   Pulse 71   Temp 99.5 F (37.5 C) (Oral)   Ht 5' 8.5" (1.74 m)   Wt 166 lb (75.3 kg)   LMP  (LMP Unknown)   SpO2 99%   BMI 24.87 kg/m   Wt Readings from Last 3 Encounters:  03/15/19 166 lb (75.3 kg)  11/23/18 167 lb (75.8 kg)  03/07/18 169 lb 8 oz (76.9 kg)    Physical Exam Vitals signs and nursing note reviewed.  Constitutional:      General: She is not in acute distress.    Appearance: She is well-developed.  HENT:     Head: Atraumatic.     Right Ear: External ear normal.     Left Ear: External ear normal.     Nose: Nose normal.     Mouth/Throat:     Pharynx: No oropharyngeal exudate.  Eyes:      General: No scleral icterus.    Conjunctiva/sclera: Conjunctivae normal.     Pupils: Pupils are equal, round, and reactive to light.  Neck:     Musculoskeletal: Normal range of motion and neck supple.     Thyroid: No thyromegaly.  Cardiovascular:     Rate and Rhythm: Normal rate  and regular rhythm.     Heart sounds: Normal heart sounds.  Pulmonary:     Effort: Pulmonary effort is normal. No respiratory distress.     Breath sounds: Normal breath sounds.  Chest:     Breasts:        Right: No mass, skin change or tenderness.        Left: No mass, skin change or tenderness.  Abdominal:     General: Bowel sounds are normal.     Palpations: Abdomen is soft. There is no mass.     Tenderness: There is no abdominal tenderness.  Genitourinary:    Comments: GU exam declined Musculoskeletal: Normal range of motion.        General: No tenderness.  Lymphadenopathy:     Cervical: No cervical adenopathy.     Upper Body:     Right upper body: No axillary adenopathy.     Left upper body: No axillary adenopathy.  Skin:    General: Skin is warm and dry.     Findings: No rash.  Neurological:     Mental Status: She is alert and oriented to person, place, and time.     Cranial Nerves: No cranial nerve deficit.  Psychiatric:        Behavior: Behavior normal.     Results for orders placed or performed in visit on 11/23/18  WET PREP FOR TRICH, YEAST, CLUE   Specimen: Vaginal; Sterile Swab   STERILE SWAB  Result Value Ref Range   Trichomonas Exam Negative Negative   Yeast Exam Negative Negative   Clue Cell Exam Negative Negative  GC/Chlamydia Probe Amp   Specimen: Urine   UR  Result Value Ref Range   Chlamydia trachomatis, NAA Negative Negative   Neisseria Gonorrhoeae by PCR Negative Negative  UA/M w/rflx Culture, Routine   Specimen: Urine   URINE  Result Value Ref Range   Specific Gravity, UA 1.015 1.005 - 1.030   pH, UA 7.0 5.0 - 7.5   Color, UA Yellow Yellow   Appearance Ur Clear  Clear   Leukocytes,UA Negative Negative   Protein,UA Negative Negative/Trace   Glucose, UA Negative Negative   Ketones, UA Negative Negative   RBC, UA Trace (A) Negative   Bilirubin, UA Negative Negative   Urobilinogen, Ur 0.2 0.2 - 1.0 mg/dL   Nitrite, UA Negative Negative  FSH  Result Value Ref Range   FSH 83.2 mIU/mL  Estrogens, Total  Result Value Ref Range   Estrogen 111 pg/mL  HIV Antibody (routine testing w rflx)  Result Value Ref Range   HIV Screen 4th Generation wRfx Non Reactive Non Reactive  HSV(herpes simplex vrs) 1+2 ab-IgG  Result Value Ref Range   HSV 1 Glycoprotein G Ab, IgG 61.80 (H) 0.00 - 0.90 index   HSV 2 IgG, Type Spec 1.00 (H) 0.00 - 0.90 index  RPR  Result Value Ref Range   RPR Ser Ql Non Reactive Non Reactive  HSV-2 IgG Supplemental Test  Result Value Ref Range   HSV-2 IgG Supplemental Test Positive (A) Negative      Assessment & Plan:   Problem List Items Addressed This Visit    None    Visit Diagnoses    Right hip pain    -  Primary   Likely arthritic, continue prn diclofenac, low impact exercises, stretches. Will obtain x-ray and consider orthopedic referral if worsening   Annual physical exam       Relevant Orders   CBC with Differential/Platelet  Comprehensive metabolic panel   Lipid Panel w/o Chol/HDL Ratio   TSH   UA/M w/rflx Culture, Routine   Encounter for screening mammogram for malignant neoplasm of breast       Relevant Orders   MM 3D SCREEN BREAST BILATERAL       Follow up plan: Return in about 1 year (around 03/14/2020) for CPE.   LABORATORY TESTING:  - Pap smear: up to date  IMMUNIZATIONS:   - Tdap: Tetanus vaccination status reviewed: last tetanus booster within 10 years. - Influenza: Refused  SCREENING: -Mammogram: Ordered today  - Colonoscopy: Refused   PATIENT COUNSELING:   Advised to take 1 mg of folate supplement per day if capable of pregnancy.   Sexuality: Discussed sexually transmitted diseases,  partner selection, use of condoms, avoidance of unintended pregnancy  and contraceptive alternatives.   Advised to avoid cigarette smoking.  I discussed with the patient that most people either abstain from alcohol or drink within safe limits (<=14/week and <=4 drinks/occasion for males, <=7/weeks and <= 3 drinks/occasion for females) and that the risk for alcohol disorders and other health effects rises proportionally with the number of drinks per week and how often a drinker exceeds daily limits.  Discussed cessation/primary prevention of drug use and availability of treatment for abuse.   Diet: Encouraged to adjust caloric intake to maintain  or achieve ideal body weight, to reduce intake of dietary saturated fat and total fat, to limit sodium intake by avoiding high sodium foods and not adding table salt, and to maintain adequate dietary potassium and calcium preferably from fresh fruits, vegetables, and low-fat dairy products.    stressed the importance of regular exercise  Injury prevention: Discussed safety belts, safety helmets, smoke detector, smoking near bedding or upholstery.   Dental health: Discussed importance of regular tooth brushing, flossing, and dental visits.    NEXT PREVENTATIVE PHYSICAL DUE IN 1 YEAR. Return in about 1 year (around 03/14/2020) for CPE.

## 2019-03-16 LAB — COMPREHENSIVE METABOLIC PANEL
ALT: 15 IU/L (ref 0–32)
AST: 14 IU/L (ref 0–40)
Albumin/Globulin Ratio: 1.7 (ref 1.2–2.2)
Albumin: 4.5 g/dL (ref 3.8–4.8)
Alkaline Phosphatase: 58 IU/L (ref 39–117)
BUN/Creatinine Ratio: 19 (ref 12–28)
BUN: 14 mg/dL (ref 8–27)
Bilirubin Total: 0.4 mg/dL (ref 0.0–1.2)
CO2: 26 mmol/L (ref 20–29)
Calcium: 10 mg/dL (ref 8.7–10.3)
Chloride: 100 mmol/L (ref 96–106)
Creatinine, Ser: 0.73 mg/dL (ref 0.57–1.00)
GFR calc Af Amer: 103 mL/min/{1.73_m2} (ref >59)
GFR calc non Af Amer: 89 mL/min/{1.73_m2} (ref >59)
Globulin, Total: 2.6 g/dL (ref 1.5–4.5)
Glucose: 80 mg/dL (ref 65–99)
Potassium: 4.6 mmol/L (ref 3.5–5.2)
Sodium: 141 mmol/L (ref 134–144)
Total Protein: 7.1 g/dL (ref 6.0–8.5)

## 2019-03-16 LAB — CBC WITH DIFFERENTIAL/PLATELET
Basophils Absolute: 0.1 10*3/uL (ref 0.0–0.2)
Basos: 1 %
EOS (ABSOLUTE): 0.2 10*3/uL (ref 0.0–0.4)
Eos: 4 %
Hematocrit: 35.9 % (ref 34.0–46.6)
Hemoglobin: 11.9 g/dL (ref 11.1–15.9)
Immature Grans (Abs): 0 10*3/uL (ref 0.0–0.1)
Immature Granulocytes: 0 %
Lymphocytes Absolute: 2.4 10*3/uL (ref 0.7–3.1)
Lymphs: 45 %
MCH: 29.2 pg (ref 26.6–33.0)
MCHC: 33.1 g/dL (ref 31.5–35.7)
MCV: 88 fL (ref 79–97)
Monocytes Absolute: 0.6 10*3/uL (ref 0.1–0.9)
Monocytes: 10 %
Neutrophils Absolute: 2.2 10*3/uL (ref 1.4–7.0)
Neutrophils: 40 %
Platelets: 365 10*3/uL (ref 150–450)
RBC: 4.08 x10E6/uL (ref 3.77–5.28)
RDW: 12.8 % (ref 11.7–15.4)
WBC: 5.5 10*3/uL (ref 3.4–10.8)

## 2019-03-16 LAB — LIPID PANEL W/O CHOL/HDL RATIO
Cholesterol, Total: 245 mg/dL — ABNORMAL HIGH (ref 100–199)
HDL: 66 mg/dL (ref >39)
LDL Chol Calc (NIH): 171 mg/dL — ABNORMAL HIGH (ref 0–99)
Triglycerides: 53 mg/dL (ref 0–149)
VLDL Cholesterol Cal: 8 mg/dL (ref 5–40)

## 2019-03-16 LAB — TSH: TSH: 2.3 u[IU]/mL (ref 0.450–4.500)

## 2019-03-18 ENCOUNTER — Encounter: Payer: Self-pay | Admitting: Family Medicine

## 2019-06-03 LAB — HM MAMMOGRAPHY

## 2020-01-09 ENCOUNTER — Ambulatory Visit: Payer: Managed Care, Other (non HMO) | Admitting: Family Medicine

## 2020-01-09 ENCOUNTER — Other Ambulatory Visit: Payer: Self-pay

## 2020-01-09 ENCOUNTER — Encounter: Payer: Self-pay | Admitting: Family Medicine

## 2020-01-09 VITALS — BP 119/73 | HR 73 | Temp 98.2°F | Wt 180.0 lb

## 2020-01-09 DIAGNOSIS — M25552 Pain in left hip: Secondary | ICD-10-CM

## 2020-01-09 DIAGNOSIS — M25551 Pain in right hip: Secondary | ICD-10-CM | POA: Diagnosis not present

## 2020-01-09 NOTE — Progress Notes (Signed)
BP 119/73   Pulse 73   Temp 98.2 F (36.8 C) (Oral)   Wt 180 lb (81.6 kg)   LMP  (LMP Unknown)   SpO2 98%   BMI 26.97 kg/m    Subjective:    Patient ID: Yolanda Rodgers, female    DOB: 1955/12/22, 64 y.o.   MRN: [id number]  HPI: Yolanda Rodgers is a 64 y.o. female  Chief Complaint  Patient presents with  . Hip Pain    bilateral x a few months   Chronic daily hip pain, b/l now not just the right side hurting. Notes stiffness and some mobility limitations at this point. Tends to sit cross legged for long periods of time for many years now, notes this seems to exacerbate things now. Voltaren gel helps but she states she doesn't use it often. Takes glucosamine and ginger to help with her joint pains. Denies injury, redness, swelling, heat in joints.   Relevant past medical, surgical, family and social history reviewed and updated as indicated. Interim medical history since our last visit reviewed. Allergies and medications reviewed and updated.  Review of Systems  Per HPI unless specifically indicated above     Objective:    BP 119/73   Pulse 73   Temp 98.2 F (36.8 C) (Oral)   Wt 180 lb (81.6 kg)   LMP  (LMP Unknown)   SpO2 98%   BMI 26.97 kg/m   Wt Readings from Last 3 Encounters:  01/09/20 180 lb (81.6 kg)  03/15/19 166 lb (75.3 kg)  11/23/18 167 lb (75.8 kg)    Physical Exam Vitals and nursing note reviewed.  Constitutional:      Appearance: Normal appearance. She is not ill-appearing.  HENT:     Head: Atraumatic.  Eyes:     Extraocular Movements: Extraocular movements intact.     Conjunctiva/sclera: Conjunctivae normal.  Cardiovascular:     Rate and Rhythm: Normal rate and regular rhythm.     Heart sounds: Normal heart sounds.  Pulmonary:     Effort: Pulmonary effort is normal.     Breath sounds: Normal breath sounds.  Musculoskeletal:        General: No swelling, tenderness, deformity or signs of injury. Normal range of motion.     Cervical back:  Normal range of motion and neck supple.  Skin:    General: Skin is warm and dry.  Neurological:     General: No focal deficit present.     Mental Status: She is alert and oriented to person, place, and time.     Sensory: No sensory deficit.     Motor: No weakness.     Gait: Gait normal.  Psychiatric:        Mood and Affect: Mood normal.        Thought Content: Thought content normal.        Judgment: Judgment normal.     Results for orders placed or performed in visit on 03/15/19  CBC with Differential/Platelet  Result Value Ref Range   WBC 5.5 3.4 - 10.8 x10E3/uL   RBC 4.08 3.77 - 5.28 x10E6/uL   Hemoglobin 11.9 11.1 - 15.9 g/dL   Hematocrit 02.6 37.8 - 46.6 %   MCV 88 79 - 97 fL   MCH 29.2 26.6 - 33.0 pg   MCHC 33.1 31 - 35 g/dL   RDW 58.8 50.2 - 77.4 %   Platelets 365 150 - 450 x10E3/uL   Neutrophils 40 Not Estab. %   Lymphs  45 Not Estab. %   Monocytes 10 Not Estab. %   Eos 4 Not Estab. %   Basos 1 Not Estab. %   Neutrophils Absolute 2.2 1 - 7 x10E3/uL   Lymphocytes Absolute 2.4 0 - 3 x10E3/uL   Monocytes Absolute 0.6 0 - 0 x10E3/uL   EOS (ABSOLUTE) 0.2 0.0 - 0.4 x10E3/uL   Basophils Absolute 0.1 0 - 0 x10E3/uL   Immature Granulocytes 0 Not Estab. %   Immature Grans (Abs) 0.0 0.0 - 0.1 x10E3/uL  Comprehensive metabolic panel  Result Value Ref Range   Glucose 80 65 - 99 mg/dL   BUN 14 8 - 27 mg/dL   Creatinine, Ser 4.81 0.57 - 1.00 mg/dL   GFR calc non Af Amer 89 >59 mL/min/1.73   GFR calc Af Amer 103 >59 mL/min/1.73   BUN/Creatinine Ratio 19 12 - 28   Sodium 141 134 - 144 mmol/L   Potassium 4.6 3.5 - 5.2 mmol/L   Chloride 100 96 - 106 mmol/L   CO2 26 20 - 29 mmol/L   Calcium 10.0 8.7 - 10.3 mg/dL   Total Protein 7.1 6.0 - 8.5 g/dL   Albumin 4.5 3.8 - 4.8 g/dL   Globulin, Total 2.6 1.5 - 4.5 g/dL   Albumin/Globulin Ratio 1.7 1.2 - 2.2   Bilirubin Total 0.4 0.0 - 1.2 mg/dL   Alkaline Phosphatase 58 39 - 117 IU/L   AST 14 0 - 40 IU/L   ALT 15 0 - 32 IU/L    Lipid Panel w/o Chol/HDL Ratio  Result Value Ref Range   Cholesterol, Total 245 (H) 100 - 199 mg/dL   Triglycerides 53 0 - 149 mg/dL   HDL 66 >85 mg/dL   VLDL Cholesterol Cal 8 5 - 40 mg/dL   LDL Chol Calc (NIH) 631 (H) 0 - 99 mg/dL  TSH  Result Value Ref Range   TSH 2.300 0.450 - 4.500 uIU/mL  UA/M w/rflx Culture, Routine   Specimen: Urine   URINE  Result Value Ref Range   Specific Gravity, UA 1.020 1.005 - 1.030   pH, UA 5.0 5.0 - 7.5   Color, UA Yellow Yellow   Appearance Ur Clear Clear   Leukocytes,UA Negative Negative   Protein,UA Negative Negative/Trace   Glucose, UA Negative Negative   Ketones, UA Negative Negative   RBC, UA Negative Negative   Bilirubin, UA Negative Negative   Urobilinogen, Ur 0.2 0.2 - 1.0 mg/dL   Nitrite, UA Negative Negative      Assessment & Plan:   Problem List Items Addressed This Visit    None    Visit Diagnoses    Bilateral hip pain    -  Primary   Suspect arthritic, continue supplements, voltaren gel. Obtain x-rays, pt agreeable to referral to PT for strengthening/mobility. Declines Ortho referral for now   Relevant Orders   DG Hip Unilat W OR W/O Pelvis 2-3 Views Left   DG Hip Unilat W OR W/O Pelvis 2-3 Views Right   Ambulatory referral to Physical Therapy       Follow up plan: Return for CPE.

## 2020-01-09 NOTE — Patient Instructions (Signed)
[facility] - M through F 8-5, no appt needed

## 2020-01-10 ENCOUNTER — Ambulatory Visit
Admission: RE | Admit: 2020-01-10 | Discharge: 2020-01-10 | Disposition: A | Discharge status: Home / Self Care | Payer: Managed Care, Other (non HMO) | Attending: Family Medicine | Admitting: Family Medicine

## 2020-01-10 ENCOUNTER — Ambulatory Visit
Admission: RE | Admit: 2020-01-10 | Discharge: 2020-01-10 | Disposition: A | Discharge status: Home / Self Care | Payer: Managed Care, Other (non HMO) | Source: Ambulatory Visit | Attending: Family Medicine | Admitting: Family Medicine

## 2020-01-10 DIAGNOSIS — M25551 Pain in right hip: Secondary | ICD-10-CM | POA: Diagnosis not present

## 2020-01-10 DIAGNOSIS — M25552 Pain in left hip: Secondary | ICD-10-CM | POA: Insufficient documentation

## 2020-02-04 ENCOUNTER — Encounter: Payer: Self-pay | Admitting: Family Medicine

## 2020-02-04 ENCOUNTER — Ambulatory Visit: Payer: Managed Care, Other (non HMO) | Admitting: Physical Therapy

## 2020-02-18 ENCOUNTER — Ambulatory Visit: Payer: Managed Care, Other (non HMO) | Admitting: Physical Therapy

## 2020-02-25 ENCOUNTER — Ambulatory Visit: Payer: Managed Care, Other (non HMO) | Admitting: Physical Therapy

## 2020-03-03 ENCOUNTER — Encounter: Payer: Managed Care, Other (non HMO) | Admitting: Physical Therapy

## 2020-03-05 ENCOUNTER — Encounter: Payer: Managed Care, Other (non HMO) | Admitting: Physical Therapy

## 2020-03-09 ENCOUNTER — Encounter: Payer: Managed Care, Other (non HMO) | Admitting: Physical Therapy

## 2020-03-11 ENCOUNTER — Encounter: Payer: Managed Care, Other (non HMO) | Admitting: Physical Therapy

## 2020-03-16 ENCOUNTER — Encounter: Payer: Managed Care, Other (non HMO) | Admitting: Physical Therapy

## 2020-03-18 ENCOUNTER — Other Ambulatory Visit: Payer: Self-pay

## 2020-03-18 ENCOUNTER — Ambulatory Visit (INDEPENDENT_AMBULATORY_CARE_PROVIDER_SITE_OTHER): Payer: Managed Care, Other (non HMO) | Admitting: Family Medicine

## 2020-03-18 ENCOUNTER — Encounter: Payer: Self-pay | Admitting: Family Medicine

## 2020-03-18 ENCOUNTER — Telehealth: Payer: Self-pay

## 2020-03-18 ENCOUNTER — Encounter: Payer: Managed Care, Other (non HMO) | Admitting: Family Medicine

## 2020-03-18 VITALS — BP 106/66 | HR 76 | Temp 98.3°F | Ht 68.3 in | Wt 186.8 lb

## 2020-03-18 DIAGNOSIS — Z13220 Encounter for screening for lipoid disorders: Secondary | ICD-10-CM

## 2020-03-18 DIAGNOSIS — Z0000 Encounter for general adult medical examination without abnormal findings: Secondary | ICD-10-CM | POA: Diagnosis not present

## 2020-03-18 DIAGNOSIS — Z1211 Encounter for screening for malignant neoplasm of colon: Secondary | ICD-10-CM | POA: Diagnosis not present

## 2020-03-18 LAB — URINALYSIS, ROUTINE W REFLEX MICROSCOPIC
Bilirubin, UA: NEGATIVE
Glucose, UA: NEGATIVE
Ketones, UA: NEGATIVE
Leukocytes,UA: NEGATIVE
Nitrite, UA: NEGATIVE
Protein,UA: NEGATIVE
RBC, UA: NEGATIVE
Specific Gravity, UA: 1.02 (ref 1.005–1.030)
Urobilinogen, Ur: 0.2 mg/dL (ref 0.2–1.0)
pH, UA: 7 (ref 5.0–7.5)

## 2020-03-18 MED ORDER — DICLOFENAC SODIUM 1 % EX GEL: 4.0000 g | Freq: Four times a day (QID) | CUTANEOUS | 12 refills | Status: DC

## 2020-03-18 NOTE — Telephone Encounter (Signed)
That's fine

## 2020-03-18 NOTE — Telephone Encounter (Signed)
Pt presented in office, went to schedule her for her cpe next year explained that she would be seeing a new provider, she is wanting to know if Dr Laural Benes would take her and her husband on next year as their primary. Please advise.

## 2020-03-18 NOTE — Progress Notes (Signed)
BP 106/66   Pulse 76   Temp 98.3 F (36.8 C) (Oral)   Ht 5' 8.3" (1.735 m)   Wt 186 lb 12.8 oz (84.7 kg)   LMP  (LMP Unknown)   SpO2 97%   BMI 28.15 kg/m    Subjective:    Patient ID: Yolanda Rodgers, female    DOB: 1955-07-12, 64 y.o.   MRN: [id number]  HPI: Yolanda Rodgers is a 64 y.o. female presenting on 03/18/2020 for comprehensive medical examination. Current medical complaints include: Has been doing PT for her hip. Feeling much better with PT.   Menopausal Symptoms: yes- not really bothering her  Depression Screen done today and results listed below:  Depression screen [facility] 2/9 03/18/2020 03/15/2019 03/07/2018 02/16/2017  Decreased Interest 0 0 0 1  Down, Depressed, Hopeless 0 0 1 2  PHQ - 2 Score 0 0 1 3  Altered sleeping - - 1 1  Tired, decreased energy - - 1 0  Change in appetite - - 0 0  Feeling bad or failure about yourself  - - 0 1  Trouble concentrating - - 0 0  Moving slowly or fidgety/restless - - 0 0  Suicidal thoughts - - 0 0  PHQ-9 Score - - 3 5    Past Medical History:  Past Medical History:  Diagnosis Date  . Cataract     Surgical History:  Past Surgical History:  Procedure Laterality Date  . BREAST BIOPSY Right 1999   negative per patient  . DILATION AND CURETTAGE OF UTERUS      Medications:  Current Outpatient Medications on File Prior to Visit  Medication Sig  . Calcium Carbonate-Vitamin D (CALCIUM-VITAMIN D3 PO) Take 1 tablet by mouth daily.  . Cyanocobalamin (VITAMIN B12 PO) Take 1 tablet by mouth daily.  . Glucosamine-Chondroitin (GLUCOSAMINE CHONDR COMPLEX PO) Take 1 tablet by mouth daily.  . Multiple Vitamin (MULTIVITAMIN) tablet Take 1 tablet by mouth daily.  . Multiple Vitamins-Minerals (ZINC PO) Take by mouth. Pt unsure of dose  . Nutritional Supplements (ESTROVEN PO) Take 1 tablet by mouth daily.  Marland Kitchen UNABLE TO FIND Take by mouth daily. Equinacea caps   No current facility-administered medications on file prior to visit.     Allergies:  No Known Allergies  Social History:  Social History   Socioeconomic History  . Marital status: Married    Spouse name: Not on file  . Number of children: Not on file  . Years of education: Not on file  . Highest education level: Not on file  Occupational History  . Not on file  Tobacco Use  . Smoking status: Never Smoker  . Smokeless tobacco: Never Used  Vaping Use  . Vaping Use: Never used  Substance and Sexual Activity  . Alcohol use: Yes    Comment: on occasion  . Drug use: No  . Sexual activity: Never  Other Topics Concern  . Not on file  Social History Narrative  . Not on file   Social Determinants of Health   Financial Resource Strain:   . Difficulty of Paying Living Expenses: Not on file  Food Insecurity:   . Worried About Programme researcher, broadcasting/film/video in the Last Year: Not on file  . Ran Out of Food in the Last Year: Not on file  Transportation Needs:   . Lack of Transportation (Medical): Not on file  . Lack of Transportation (Non-Medical): Not on file  Physical Activity:   . Days of Exercise per Week:  Not on file  . Minutes of Exercise per Session: Not on file  Stress:   . Feeling of Stress : Not on file  Social Connections:   . Frequency of Communication with Friends and Family: Not on file  . Frequency of Social Gatherings with Friends and Family: Not on file  . Attends Religious Services: Not on file  . Active Member of Clubs or Organizations: Not on file  . Attends BankerClub or Organization Meetings: Not on file  . Marital Status: Not on file  Intimate Partner Violence:   . Fear of Current or Ex-Partner: Not on file  . Emotionally Abused: Not on file  . Physically Abused: Not on file  . Sexually Abused: Not on file   Social History   Tobacco Use  Smoking Status Never Smoker  Smokeless Tobacco Never Used   Social History   Substance and Sexual Activity  Alcohol Use Yes   Comment: on occasion    Family History:  Family History   Adopted: Yes    Past medical history, surgical history, medications, allergies, family history and social history reviewed with patient today and changes made to appropriate areas of the chart.   Review of Systems  Constitutional: Positive for diaphoresis. Negative for chills, fever, malaise/fatigue and weight loss.  HENT: Negative.   Eyes: Negative.   Respiratory: Negative.   Cardiovascular: Negative.   Gastrointestinal: Negative.   Genitourinary: Negative.   Musculoskeletal: Positive for joint pain and myalgias. Negative for back pain, falls and neck pain.  Skin: Negative.   Neurological: Negative.   Endo/Heme/Allergies: Negative.   Psychiatric/Behavioral: Negative.     All other ROS negative except what is listed above and in the HPI.      Objective:    BP 106/66   Pulse 76   Temp 98.3 F (36.8 C) (Oral)   Ht 5' 8.3" (1.735 m)   Wt 186 lb 12.8 oz (84.7 kg)   LMP  (LMP Unknown)   SpO2 97%   BMI 28.15 kg/m   Wt Readings from Last 3 Encounters:  03/18/20 186 lb 12.8 oz (84.7 kg)  01/09/20 180 lb (81.6 kg)  03/15/19 166 lb (75.3 kg)    Physical Exam Vitals and nursing note reviewed.  Constitutional:      General: She is not in acute distress.    Appearance: Normal appearance. She is not ill-appearing, toxic-appearing or diaphoretic.  HENT:     Head: Normocephalic and atraumatic.     Right Ear: Tympanic membrane, ear canal and external ear normal. There is no impacted cerumen.     Left Ear: Tympanic membrane, ear canal and external ear normal. There is no impacted cerumen.     Nose: Nose normal. No congestion or rhinorrhea.     Mouth/Throat:     Mouth: Mucous membranes are moist.     Pharynx: Oropharynx is clear. No oropharyngeal exudate or posterior oropharyngeal erythema.  Eyes:     General: No scleral icterus.       Right eye: No discharge.        Left eye: No discharge.     Extraocular Movements: Extraocular movements intact.     Conjunctiva/sclera:  Conjunctivae normal.     Pupils: Pupils are equal, round, and reactive to light.  Neck:     Vascular: No carotid bruit.  Cardiovascular:     Rate and Rhythm: Normal rate and regular rhythm.     Pulses: Normal pulses.     Heart sounds: No murmur  heard.  No friction rub. No gallop.   Pulmonary:     Effort: Pulmonary effort is normal. No respiratory distress.     Breath sounds: Normal breath sounds. No stridor. No wheezing, rhonchi or rales.  Chest:     Chest wall: No tenderness.  Abdominal:     General: Abdomen is flat. Bowel sounds are normal. There is no distension.     Palpations: Abdomen is soft. There is no mass.     Tenderness: There is no abdominal tenderness. There is no right CVA tenderness, left CVA tenderness, guarding or rebound.     Hernia: No hernia is present.  Genitourinary:    Comments: Breast and pelvic exams deferred with shared decision making Musculoskeletal:        General: No swelling, tenderness, deformity or signs of injury.     Cervical back: Normal range of motion and neck supple. No rigidity. No muscular tenderness.     Right lower leg: No edema.     Left lower leg: No edema.  Lymphadenopathy:     Cervical: No cervical adenopathy.  Skin:    General: Skin is warm and dry.     Capillary Refill: Capillary refill takes less than 2 seconds.     Coloration: Skin is not jaundiced or pale.     Findings: No bruising, erythema, lesion or rash.  Neurological:     General: No focal deficit present.     Mental Status: She is alert and oriented to person, place, and time. Mental status is at baseline.     Cranial Nerves: No cranial nerve deficit.     Sensory: No sensory deficit.     Motor: No weakness.     Coordination: Coordination normal.     Gait: Gait normal.     Deep Tendon Reflexes: Reflexes normal.  Psychiatric:        Mood and Affect: Mood normal.        Behavior: Behavior normal.        Thought Content: Thought content normal.        Judgment:  Judgment normal.     Results for orders placed or performed in visit on 03/18/20  HM MAMMOGRAPHY  Result Value Ref Range   HM Mammogram 0-4 Bi-Rad 0-4 Bi-Rad, Self Reported Normal      Assessment & Plan:   Problem List Items Addressed This Visit    None    Visit Diagnoses    Routine general medical examination at a health care facility    -  Primary   Vaccines up to date. Screening labs checked today. Pap up to date. Mammogram up to date. Cologuard ordered today. Call with any concerns. Continue diet/exercise   Relevant Orders   CBC with Differential/Platelet   Comprehensive metabolic panel   Lipid Panel w/o Chol/HDL Ratio   TSH   Urinalysis, Routine w reflex microscopic   Screening for cholesterol level       Labs drawn today. Await results.    Relevant Orders   Lipid Panel w/o Chol/HDL Ratio   Screening for colon cancer       Cologuard ordered today. Call with any concerns.    Relevant Orders   Cologuard       Follow up plan: Return in about 1 year (around 03/18/2021) for physical.   LABORATORY TESTING:  - Pap smear: up to date  IMMUNIZATIONS:   - Tdap: Tetanus vaccination status reviewed: last tetanus booster within 10 years. - Influenza: Refused - Pneumovax:  Not applicable - COVID: Refused  SCREENING: -Mammogram: Up to date  - Colonoscopy: cologuard ordered today    PATIENT COUNSELING:   Advised to take 1 mg of folate supplement per day if capable of pregnancy.   Sexuality: Discussed sexually transmitted diseases, partner selection, use of condoms, avoidance of unintended pregnancy  and contraceptive alternatives.   Advised to avoid cigarette smoking.  I discussed with the patient that most people either abstain from alcohol or drink within safe limits (<=14/week and <=4 drinks/occasion for males, <=7/weeks and <= 3 drinks/occasion for females) and that the risk for alcohol disorders and other health effects rises proportionally with the number of  drinks per week and how often a drinker exceeds daily limits.  Discussed cessation/primary prevention of drug use and availability of treatment for abuse.   Diet: Encouraged to adjust caloric intake to maintain  or achieve ideal body weight, to reduce intake of dietary saturated fat and total fat, to limit sodium intake by avoiding high sodium foods and not adding table salt, and to maintain adequate dietary potassium and calcium preferably from fresh fruits, vegetables, and low-fat dairy products.    stressed the importance of regular exercise  Injury prevention: Discussed safety belts, safety helmets, smoke detector, smoking near bedding or upholstery.   Dental health: Discussed importance of regular tooth brushing, flossing, and dental visits.    NEXT PREVENTATIVE PHYSICAL DUE IN 1 YEAR. Return in about 1 year (around 03/18/2021) for physical.

## 2020-03-18 NOTE — Patient Instructions (Signed)
Health Maintenance, Female Adopting a healthy lifestyle and getting preventive care are important in promoting health and wellness. Ask your health care provider about:  The right schedule for you to have regular tests and exams.  Things you can do on your own to prevent diseases and keep yourself healthy. What should I know about diet, weight, and exercise? Eat a healthy diet   Eat a diet that includes plenty of vegetables, fruits, low-fat dairy products, and lean protein.  Do not eat a lot of foods that are high in solid fats, added sugars, or sodium. Maintain a healthy weight Body mass index (BMI) is used to identify weight problems. It estimates body fat based on height and weight. Your health care provider can help determine your BMI and help you achieve or maintain a healthy weight. Get regular exercise Get regular exercise. This is one of the most important things you can do for your health. Most adults should:  Exercise for at least 150 minutes each week. The exercise should increase your heart rate and make you sweat (moderate-intensity exercise).  Do strengthening exercises at least twice a week. This is in addition to the moderate-intensity exercise.  Spend less time sitting. Even light physical activity can be beneficial. Watch cholesterol and blood lipids Have your blood tested for lipids and cholesterol at 63 years of age, then have this test every 5 years. Have your cholesterol levels checked more often if:  Your lipid or cholesterol levels are high.  You are older than 63 years of age.  You are at high risk for heart disease. What should I know about cancer screening? Depending on your health history and family history, you may need to have cancer screening at various ages. This may include screening for:  Breast cancer.  Cervical cancer.  Colorectal cancer.  Skin cancer.  Lung cancer. What should I know about heart disease, diabetes, and high blood  pressure? Blood pressure and heart disease  High blood pressure causes heart disease and increases the risk of stroke. This is more likely to develop in people who have high blood pressure readings, are of African descent, or are overweight.  Have your blood pressure checked: ? Every 3-5 years if you are 18-39 years of age. ? Every year if you are 40 years old or older. Diabetes Have regular diabetes screenings. This checks your fasting blood sugar level. Have the screening done:  Once every three years after age 40 if you are at a normal weight and have a low risk for diabetes.  More often and at a younger age if you are overweight or have a high risk for diabetes. What should I know about preventing infection? Hepatitis B If you have a higher risk for hepatitis B, you should be screened for this virus. Talk with your health care provider to find out if you are at risk for hepatitis B infection. Hepatitis C Testing is recommended for:  Everyone born from 1945 through 1965.  Anyone with known risk factors for hepatitis C. Sexually transmitted infections (STIs)  Get screened for STIs, including gonorrhea and chlamydia, if: ? You are sexually active and are younger than 63 years of age. ? You are older than 63 years of age and your health care provider tells you that you are at risk for this type of infection. ? Your sexual activity has changed since you were last screened, and you are at increased risk for chlamydia or gonorrhea. Ask your health care provider if   you are at risk.  Ask your health care provider about whether you are at high risk for HIV. Your health care provider may recommend a prescription medicine to help prevent HIV infection. If you choose to take medicine to prevent HIV, you should first get tested for HIV. You should then be tested every 3 months for as long as you are taking the medicine. Pregnancy  If you are about to stop having your period (premenopausal) and  you may become pregnant, seek counseling before you get pregnant.  Take 400 to 800 micrograms (mcg) of folic acid every day if you become pregnant.  Ask for birth control (contraception) if you want to prevent pregnancy. Osteoporosis and menopause Osteoporosis is a disease in which the bones lose minerals and strength with aging. This can result in bone fractures. If you are 65 years old or older, or if you are at risk for osteoporosis and fractures, ask your health care provider if you should:  Be screened for bone loss.  Take a calcium or vitamin D supplement to lower your risk of fractures.  Be given hormone replacement therapy (HRT) to treat symptoms of menopause. Follow these instructions at home: Lifestyle  Do not use any products that contain nicotine or tobacco, such as cigarettes, e-cigarettes, and chewing tobacco. If you need help quitting, ask your health care provider.  Do not use street drugs.  Do not share needles.  Ask your health care provider for help if you need support or information about quitting drugs. Alcohol use  Do not drink alcohol if: ? Your health care provider tells you not to drink. ? You are pregnant, may be pregnant, or are planning to become pregnant.  If you drink alcohol: ? Limit how much you use to 0-1 drink a day. ? Limit intake if you are breastfeeding.  Be aware of how much alcohol is in your drink. In the [place] one drink equals one 12 oz bottle of beer (355 mL), one 5 oz glass of wine (148 mL), or one 1 oz glass of hard liquor (44 mL). General instructions  Schedule regular health, dental, and eye exams.  Stay current with your vaccines.  Tell your health care provider if: ? You often feel depressed. ? You have ever been abused or do not feel safe at home. Summary  Adopting a healthy lifestyle and getting preventive care are important in promoting health and wellness.  Follow your health care provider's instructions about healthy  diet, exercising, and getting tested or screened for diseases.  Follow your health care provider's instructions on monitoring your cholesterol and blood pressure. This information is not intended to replace advice given to you by your health care provider. Make sure you discuss any questions you have with your health care provider. Document Revised: 05/16/2018 Document Reviewed: 05/16/2018 Elsevier Patient Education  2020 [employer] Inc[employer]  

## 2020-03-19 ENCOUNTER — Encounter: Payer: Managed Care, Other (non HMO) | Admitting: Physical Therapy

## 2020-03-19 LAB — CBC WITH DIFFERENTIAL/PLATELET
Basophils Absolute: 0.1 10*3/uL (ref 0.0–0.2)
Basos: 2 %
EOS (ABSOLUTE): 0.3 10*3/uL (ref 0.0–0.4)
Eos: 5 %
Hematocrit: 38.1 % (ref 34.0–46.6)
Hemoglobin: 12.2 g/dL (ref 11.1–15.9)
Immature Grans (Abs): 0 10*3/uL (ref 0.0–0.1)
Immature Granulocytes: 0 %
Lymphocytes Absolute: 2.4 10*3/uL (ref 0.7–3.1)
Lymphs: 41 %
MCH: 28.8 pg (ref 26.6–33.0)
MCHC: 32 g/dL (ref 31.5–35.7)
MCV: 90 fL (ref 79–97)
Monocytes Absolute: 0.5 10*3/uL (ref 0.1–0.9)
Monocytes: 9 %
Neutrophils Absolute: 2.6 10*3/uL (ref 1.4–7.0)
Neutrophils: 43 %
Platelets: 361 10*3/uL (ref 150–450)
RBC: 4.24 x10E6/uL (ref 3.77–5.28)
RDW: 13.1 % (ref 11.7–15.4)
WBC: 6 10*3/uL (ref 3.4–10.8)

## 2020-03-19 LAB — COMPREHENSIVE METABOLIC PANEL
ALT: 12 IU/L (ref 0–32)
AST: 16 IU/L (ref 0–40)
Albumin/Globulin Ratio: 1.8 (ref 1.2–2.2)
Albumin: 4.6 g/dL (ref 3.8–4.8)
Alkaline Phosphatase: 68 IU/L (ref 44–121)
BUN/Creatinine Ratio: 21 (ref 12–28)
BUN: 13 mg/dL (ref 8–27)
Bilirubin Total: 0.4 mg/dL (ref 0.0–1.2)
CO2: 28 mmol/L (ref 20–29)
Calcium: 9.8 mg/dL (ref 8.7–10.3)
Chloride: 101 mmol/L (ref 96–106)
Creatinine, Ser: 0.62 mg/dL (ref 0.57–1.00)
GFR calc Af Amer: 112 mL/min/{1.73_m2} (ref >59)
GFR calc non Af Amer: 97 mL/min/{1.73_m2} (ref >59)
Globulin, Total: 2.6 g/dL (ref 1.5–4.5)
Glucose: 84 mg/dL (ref 65–99)
Potassium: 4.4 mmol/L (ref 3.5–5.2)
Sodium: 141 mmol/L (ref 134–144)
Total Protein: 7.2 g/dL (ref 6.0–8.5)

## 2020-03-19 LAB — LIPID PANEL W/O CHOL/HDL RATIO
Cholesterol, Total: 252 mg/dL — ABNORMAL HIGH (ref 100–199)
HDL: 58 mg/dL (ref >39)
LDL Chol Calc (NIH): 180 mg/dL — ABNORMAL HIGH (ref 0–99)
Triglycerides: 84 mg/dL (ref 0–149)
VLDL Cholesterol Cal: 14 mg/dL (ref 5–40)

## 2020-03-19 LAB — TSH: TSH: 1.91 u[IU]/mL (ref 0.450–4.500)

## 2020-03-23 ENCOUNTER — Encounter: Payer: Managed Care, Other (non HMO) | Admitting: Physical Therapy

## 2020-03-26 ENCOUNTER — Encounter: Payer: Managed Care, Other (non HMO) | Admitting: Physical Therapy

## 2020-03-31 ENCOUNTER — Encounter: Payer: Managed Care, Other (non HMO) | Admitting: Physical Therapy

## 2020-04-02 ENCOUNTER — Encounter: Payer: Managed Care, Other (non HMO) | Admitting: Physical Therapy

## 2020-04-11 ENCOUNTER — Encounter: Payer: Self-pay | Admitting: Family Medicine

## 2020-05-05 LAB — FECAL OCCULT BLOOD, GUAIAC: Fecal Occult Blood: NEGATIVE

## 2020-05-06 LAB — COLOGUARD: Cologuard: NEGATIVE

## 2020-05-12 LAB — COLOGUARD: COLOGUARD: NEGATIVE

## 2020-05-12 LAB — EXTERNAL GENERIC LAB PROCEDURE: COLOGUARD: NEGATIVE

## 2020-06-06 ENCOUNTER — Encounter: Payer: Self-pay | Admitting: Family Medicine

## 2020-06-06 DIAGNOSIS — M25552 Pain in left hip: Secondary | ICD-10-CM

## 2020-06-27 ENCOUNTER — Encounter: Payer: Self-pay | Admitting: Family Medicine

## 2020-07-03 ENCOUNTER — Inpatient Hospital Stay: Admit: 2020-07-03 | Payer: Managed Care, Other (non HMO)

## 2020-07-24 ENCOUNTER — Telehealth: Payer: Self-pay

## 2020-07-24 NOTE — Telephone Encounter (Signed)
Scheduled 3/1

## 2020-07-24 NOTE — Telephone Encounter (Signed)
Please call and schedule appointment for pre-op forms to be filled out. Form in incomplete bin.

## 2020-07-24 NOTE — Telephone Encounter (Signed)
Called pt no answer left vm 

## 2020-08-04 ENCOUNTER — Encounter: Payer: Self-pay | Admitting: Family Medicine

## 2020-08-04 ENCOUNTER — Other Ambulatory Visit: Payer: Self-pay

## 2020-08-04 ENCOUNTER — Ambulatory Visit: Payer: Managed Care, Other (non HMO) | Admitting: Family Medicine

## 2020-08-04 VITALS — BP 109/70 | HR 81 | Temp 98.8°F | Wt 185.6 lb

## 2020-08-04 DIAGNOSIS — Z96641 Presence of right artificial hip joint: Secondary | ICD-10-CM

## 2020-08-04 DIAGNOSIS — Z01818 Encounter for other preprocedural examination: Secondary | ICD-10-CM | POA: Diagnosis not present

## 2020-08-04 HISTORY — DX: Presence of right artificial hip joint: Z96.641

## 2020-08-04 LAB — COAGUCHEK XS/INR WAIVED
INR: 1.2 — ABNORMAL HIGH (ref 0.9–1.1)
Prothrombin Time: 14.3 s

## 2020-08-04 NOTE — Progress Notes (Signed)
BP 109/70   Pulse 81   Temp 98.8 F (37.1 C)   Wt 185 lb 9.6 oz (84.2 kg)   LMP  (LMP Unknown)   SpO2 99%   BMI 27.97 kg/m    Subjective:    Patient ID: Yolanda Rodgers, female    DOB: 1955/07/17, 65 y.o.   MRN: [id number]  HPI: [name] is a 65 y.o. female  Chief Complaint  Patient presents with  . surgery clearance   Maurine Minister presents today for surgery clearance for a total hip arthroplasty. She is feeling well today except for her hip pain. She notes that she has never had general anesthesia before. She has only had local. She has no family history of issues with anesthesia. No family history of post-op N/V. No family history of malignant hyperthermia. She is generally pretty healthy. She notes that she has not had any CP, No SOB. She can walk up several flights of stairs easily without getting SOB or having CP if her hip was not bothering her. She is otherwise doing well with no other concerns or complaints at this time.    Active Ambulatory Problems    Diagnosis Date Noted  . Cataract 02/17/2017   Resolved Ambulatory Problems    Diagnosis Date Noted  . No Resolved Ambulatory Problems   No Additional Past Medical History   Past Surgical History:  Procedure Laterality Date  . BREAST BIOPSY Right 1999   negative per patient  . DILATION AND CURETTAGE OF UTERUS     Outpatient Encounter Medications as of 08/04/2020  Medication Sig Note  . Calcium Carbonate-Vitamin D (CALCIUM-VITAMIN D3 PO) Take 1 tablet by mouth daily. 02/16/2017: Pt unsure of dose  . Cyanocobalamin (VITAMIN B12 PO) Take 1 tablet by mouth daily. 02/16/2017: Pt unsure of dose  . diclofenac Sodium (VOLTAREN) 1 % GEL Apply 4 g topically 4 (four) times daily.   . Glucosamine-Chondroitin (GLUCOSAMINE CHONDR COMPLEX PO) Take 1 tablet by mouth daily. 02/16/2017: Pt unsure of dose  . Multiple Vitamin (MULTIVITAMIN) tablet Take 1 tablet by mouth daily.   . Multiple Vitamins-Minerals (ZINC PO) Take by mouth. Pt  unsure of dose   . Nutritional Supplements (ESTROVEN PO) Take 1 tablet by mouth daily.   Marland Kitchen tiZANidine (ZANAFLEX) 2 MG tablet Take 2 mg by mouth 3 (three) times daily as needed.   Marland Kitchen UNABLE TO FIND Take by mouth daily. Equinacea caps    No facility-administered encounter medications on file as of 08/04/2020.   No Known Allergies  Social History   Socioeconomic History  . Marital status: Married    Spouse name: Not on file  . Number of children: Not on file  . Years of education: Not on file  . Highest education level: Not on file  Occupational History  . Not on file  Tobacco Use  . Smoking status: Never Smoker  . Smokeless tobacco: Never Used  Vaping Use  . Vaping Use: Never used  Substance and Sexual Activity  . Alcohol use: Yes    Comment: on occasion  . Drug use: No  . Sexual activity: Never  Other Topics Concern  . Not on file  Social History Narrative  . Not on file   Social Determinants of Health   Financial Resource Strain: Not on file  Food Insecurity: Not on file  Transportation Needs: Not on file  Physical Activity: Not on file  Stress: Not on file  Social Connections: Not on file   Family History  Adopted: Yes    Review of Systems  Constitutional: Negative.   HENT: Negative.   Respiratory: Negative.   Cardiovascular: Negative.   Gastrointestinal: Negative.   Musculoskeletal: Negative.   Neurological: Negative.   Psychiatric/Behavioral: Negative.     Per HPI unless specifically indicated above     Objective:    BP 109/70   Pulse 81   Temp 98.8 F (37.1 C)   Wt 185 lb 9.6 oz (84.2 kg)   LMP  (LMP Unknown)   SpO2 99%   BMI 27.97 kg/m   Wt Readings from Last 3 Encounters:  08/04/20 185 lb 9.6 oz (84.2 kg)  03/18/20 186 lb 12.8 oz (84.7 kg)  01/09/20 180 lb (81.6 kg)    Physical Exam Vitals and nursing note reviewed.  Constitutional:      General: She is not in acute distress.    Appearance: Normal appearance. She is not  ill-appearing, toxic-appearing or diaphoretic.  HENT:     Head: Normocephalic and atraumatic.     Right Ear: External ear normal.     Left Ear: External ear normal.     Nose: Nose normal.     Mouth/Throat:     Mouth: Mucous membranes are moist.     Pharynx: Oropharynx is clear.  Eyes:     General: No scleral icterus.       Right eye: No discharge.        Left eye: No discharge.     Extraocular Movements: Extraocular movements intact.     Conjunctiva/sclera: Conjunctivae normal.     Pupils: Pupils are equal, round, and reactive to light.  Cardiovascular:     Rate and Rhythm: Normal rate and regular rhythm.     Pulses: Normal pulses.     Heart sounds: Normal heart sounds. No murmur heard. No friction rub. No gallop.   Pulmonary:     Effort: Pulmonary effort is normal. No respiratory distress.     Breath sounds: Normal breath sounds. No stridor. No wheezing, rhonchi or rales.  Chest:     Chest wall: No tenderness.  Musculoskeletal:        General: Normal range of motion.     Cervical back: Normal range of motion and neck supple.  Skin:    General: Skin is warm and dry.     Capillary Refill: Capillary refill takes less than 2 seconds.     Coloration: Skin is not jaundiced or pale.     Findings: No bruising, erythema, lesion or rash.  Neurological:     General: No focal deficit present.     Mental Status: She is alert and oriented to person, place, and time. Mental status is at baseline.  Psychiatric:        Mood and Affect: Mood normal.        Behavior: Behavior normal.        Thought Content: Thought content normal.        Judgment: Judgment normal.     Results for orders placed or performed in visit on 05/12/20  Fecal Occult Blood, Guaiac  Result Value Ref Range   Fecal Occult Blood Negative       Assessment & Plan:   Problem List Items Addressed This Visit   None   Visit Diagnoses    Preop exam for internal medicine    -  Primary   EKG normal. Checking labs.  Assuming normal labs, should be considered low risk for surgery. Await results.    Relevant  Orders   CBC with Differential/Platelet   CoaguChek XS/INR Waived   EKG 12-Lead (Completed)   Comprehensive metabolic panel   Hgb A1c w/o eAG       Follow up plan: Return if symptoms worsen or fail to improve.  >30 minutes spent with patient today.

## 2020-08-05 LAB — CBC WITH DIFFERENTIAL/PLATELET
Basophils Absolute: 0.1 10*3/uL (ref 0.0–0.2)
Basos: 2 %
EOS (ABSOLUTE): 0.2 10*3/uL (ref 0.0–0.4)
Eos: 4 %
Hematocrit: 38.7 % (ref 34.0–46.6)
Hemoglobin: 12.5 g/dL (ref 11.1–15.9)
Immature Grans (Abs): 0 10*3/uL (ref 0.0–0.1)
Immature Granulocytes: 0 %
Lymphocytes Absolute: 2 10*3/uL (ref 0.7–3.1)
Lymphs: 42 %
MCH: 28.5 pg (ref 26.6–33.0)
MCHC: 32.3 g/dL (ref 31.5–35.7)
MCV: 88 fL (ref 79–97)
Monocytes Absolute: 0.4 10*3/uL (ref 0.1–0.9)
Monocytes: 8 %
Neutrophils Absolute: 2.1 10*3/uL (ref 1.4–7.0)
Neutrophils: 44 %
Platelets: 355 10*3/uL (ref 150–450)
RBC: 4.38 x10E6/uL (ref 3.77–5.28)
RDW: 12.9 % (ref 11.7–15.4)
WBC: 4.8 10*3/uL (ref 3.4–10.8)

## 2020-08-05 LAB — HGB A1C W/O EAG: Hgb A1c MFr Bld: 5.6 % (ref 4.8–5.6)

## 2020-08-05 LAB — COMPREHENSIVE METABOLIC PANEL
ALT: 13 IU/L (ref 0–32)
AST: 15 IU/L (ref 0–40)
Albumin/Globulin Ratio: 1.6 (ref 1.2–2.2)
Albumin: 4.6 g/dL (ref 3.8–4.8)
Alkaline Phosphatase: 66 IU/L (ref 44–121)
BUN/Creatinine Ratio: 20 (ref 12–28)
BUN: 15 mg/dL (ref 8–27)
Bilirubin Total: 0.3 mg/dL (ref 0.0–1.2)
CO2: 23 mmol/L (ref 20–29)
Calcium: 10 mg/dL (ref 8.7–10.3)
Chloride: 104 mmol/L (ref 96–106)
Creatinine, Ser: 0.76 mg/dL (ref 0.57–1.00)
Globulin, Total: 2.9 g/dL (ref 1.5–4.5)
Glucose: 80 mg/dL (ref 65–99)
Potassium: 4.3 mmol/L (ref 3.5–5.2)
Sodium: 143 mmol/L (ref 134–144)
Total Protein: 7.5 g/dL (ref 6.0–8.5)
eGFR: 88 mL/min/{1.73_m2} (ref >59)

## 2020-08-08 NOTE — Progress Notes (Signed)
Labs are normal. Cleared for surgery. Will send copy to her orthopedist.   Please send copy of her note to ortho. Thanks.

## 2020-08-08 NOTE — Progress Notes (Signed)
Interpreted by me on 08/04/20/. NSR at 67bpm with no ST segment changes

## 2021-01-25 ENCOUNTER — Ambulatory Visit (INDEPENDENT_AMBULATORY_CARE_PROVIDER_SITE_OTHER): Payer: Managed Care, Other (non HMO) | Admitting: Nurse Practitioner

## 2021-01-25 ENCOUNTER — Other Ambulatory Visit: Payer: Self-pay

## 2021-01-25 ENCOUNTER — Encounter: Payer: Self-pay | Admitting: Nurse Practitioner

## 2021-01-25 VITALS — BP 104/70 | HR 80 | Temp 98.2°F | Ht 68.11 in | Wt 181.8 lb

## 2021-01-25 DIAGNOSIS — B351 Tinea unguium: Secondary | ICD-10-CM

## 2021-01-25 NOTE — Patient Instructions (Signed)
It was great to see you!  I will place a referral to podiatry for you today. Continue soaking your feet daily with epsom salts and tea tree oil. Keep your feet dry and don't wear tight shoes.    If a referral was placed today, you will be contacted for an appointment. Please note that routine referrals can sometimes take up to 3-4 weeks to process. Please call our office if you haven't heard anything after this time frame.  Take care,  Rodman Pickle, NP

## 2021-01-25 NOTE — Progress Notes (Signed)
Acute Office Visit  Subjective:    Patient ID: Yolanda Rodgers, female    DOB: Jan 18, 1956, 65 y.o.   MRN: [id number]  Chief Complaint  Patient presents with   Nail Problem    Has had for awhile, has been trying at home remedies.   Ingrown Toenail    For past week, getting uncomfortable.     HPI Patient is in today for toe nail fungus and an ingrown toe nail. She has a history of ingrown toenail on the right foot.  She stated that she has had ingrown toenail removal in the past on this foot.  Now she has noticed some ingrown toenails on her left great toe.  She has been soaking her toe with tea tree oil and also vinegar.  She has also trimmed her nails and believes that she got ingrown nail out.  She has had multiple toenails with fungus and discoloration.  She has not tried anything over-the-counter.  She noticed some redness on her left great toe however that has resolved.  She denies any swelling at this point.   Past Medical History:  Diagnosis Date   Cataract     Past Surgical History:  Procedure Laterality Date   BREAST BIOPSY Right 1999   negative per patient   DILATION AND CURETTAGE OF UTERUS      Family History  Adopted: Yes    Social History   Socioeconomic History   Marital status: Married    Spouse name: Not on file   Number of children: Not on file   Years of education: Not on file   Highest education level: Not on file  Occupational History   Not on file  Tobacco Use   Smoking status: Never   Smokeless tobacco: Never  Vaping Use   Vaping Use: Never used  Substance and Sexual Activity   Alcohol use: Yes    Comment: on occasion   Drug use: No   Sexual activity: Never  Other Topics Concern   Not on file  Social History Narrative   Not on file   Social Determinants of Health   Financial Resource Strain: Not on file  Food Insecurity: Not on file  Transportation Needs: Not on file  Physical Activity: Not on file  Stress: Not on file  Social  Connections: Not on file  Intimate Partner Violence: Not on file    Outpatient Medications Prior to Visit  Medication Sig Dispense Refill   Calcium Carbonate-Vitamin D (CALCIUM-VITAMIN D3 PO) Take 1 tablet by mouth daily.     Cyanocobalamin (VITAMIN B12 PO) Take 1 tablet by mouth daily.     diclofenac Sodium (VOLTAREN) 1 % GEL Apply 4 g topically 4 (four) times daily. 100 g 12   Glucosamine-Chondroitin (GLUCOSAMINE CHONDR COMPLEX PO) Take 1 tablet by mouth daily.     Multiple Vitamin (MULTIVITAMIN) tablet Take 1 tablet by mouth daily.     Multiple Vitamins-Minerals (ZINC PO) Take by mouth. Pt unsure of dose     Nutritional Supplements (ESTROVEN PO) Take 1 tablet by mouth daily.     prednisoLONE acetate (PRED FORTE) 1 % ophthalmic suspension Place 1 drop into the right eye every morning.     tiZANidine (ZANAFLEX) 2 MG tablet Take 2 mg by mouth 3 (three) times daily as needed.     UNABLE TO FIND Take by mouth daily. Equinacea caps     No facility-administered medications prior to visit.    No Known Allergies  Review of Systems  Respiratory: Negative.    Cardiovascular: Negative.   Skin:        Toe nail discoloration      Objective:    Physical Exam Vitals and nursing note reviewed.  Constitutional:      General: She is not in acute distress.    Appearance: Normal appearance.  HENT:     Head: Normocephalic.  Eyes:     Conjunctiva/sclera: Conjunctivae normal.  Cardiovascular:     Rate and Rhythm: Normal rate and regular rhythm.     Pulses: Normal pulses.     Heart sounds: Normal heart sounds.  Pulmonary:     Effort: Pulmonary effort is normal.     Breath sounds: Normal breath sounds.  Musculoskeletal:     Cervical back: Normal range of motion.  Skin:    General: Skin is warm.     Comments: Multiple toe nails on bilateral feet discolored and thick  Neurological:     General: No focal deficit present.     Mental Status: She is alert and oriented to person, place, and  time.  Psychiatric:        Mood and Affect: Mood normal.        Behavior: Behavior normal.        Thought Content: Thought content normal.        Judgment: Judgment normal.    Ht 5' 8.11" (1.73 m)   Wt 181 lb 12.8 oz (82.5 kg)   LMP  (LMP Unknown)   BMI 27.55 kg/m  Wt Readings from Last 3 Encounters:  01/25/21 181 lb 12.8 oz (82.5 kg)  08/04/20 185 lb 9.6 oz (84.2 kg)  03/18/20 186 lb 12.8 oz (84.7 kg)    Health Maintenance Due  Topic Date Due   INFLUENZA VACCINE  01/04/2021    There are no preventive care reminders to display for this patient.   Lab Results  Component Value Date   TSH 1.910 03/18/2020   Lab Results  Component Value Date   WBC 4.8 08/04/2020   HGB 12.5 08/04/2020   HCT 38.7 08/04/2020   MCV 88 08/04/2020   PLT 355 08/04/2020   Lab Results  Component Value Date   NA 143 08/04/2020   K 4.3 08/04/2020   CO2 23 08/04/2020   GLUCOSE 80 08/04/2020   BUN 15 08/04/2020   CREATININE 0.76 08/04/2020   BILITOT 0.3 08/04/2020   ALKPHOS 66 08/04/2020   AST 15 08/04/2020   ALT 13 08/04/2020   PROT 7.5 08/04/2020   ALBUMIN 4.6 08/04/2020   CALCIUM 10.0 08/04/2020   EGFR 88 08/04/2020   Lab Results  Component Value Date   CHOL 252 (H) 03/18/2020   Lab Results  Component Value Date   HDL 58 03/18/2020   Lab Results  Component Value Date   LDLCALC 180 (H) 03/18/2020   Lab Results  Component Value Date   TRIG 84 03/18/2020   No results found for: CHOLHDL Lab Results  Component Value Date   HGBA1C 5.6 08/04/2020       Assessment & Plan:   Problem List Items Addressed This Visit   None    No orders of the defined types were placed in this encounter.    Charyl Dancer, NP

## 2021-02-16 ENCOUNTER — Other Ambulatory Visit: Payer: Self-pay

## 2021-02-16 ENCOUNTER — Ambulatory Visit (INDEPENDENT_AMBULATORY_CARE_PROVIDER_SITE_OTHER): Payer: Managed Care, Other (non HMO) | Admitting: Podiatry

## 2021-02-16 DIAGNOSIS — L600 Ingrowing nail: Secondary | ICD-10-CM

## 2021-02-16 DIAGNOSIS — B351 Tinea unguium: Secondary | ICD-10-CM | POA: Diagnosis not present

## 2021-02-16 DIAGNOSIS — M79674 Pain in right toe(s): Secondary | ICD-10-CM | POA: Diagnosis not present

## 2021-02-16 MED ORDER — TERBINAFINE HCL 250 MG PO TABS: 250.0000 mg | ORAL_TABLET | Freq: Every day | ORAL | 0 refills | Status: DC

## 2021-02-16 NOTE — Progress Notes (Signed)
   Subjective: Patient presents today for evaluation of pain to the medial border left great toe. Patient is concerned for possible ingrown nail.  It is very sensitive to touch.    Patient also states that she has been dealing with thickened discolored toenails to the right great toe as well.  She is concerned for possible toenail fungus.  She presents for further treatment and evaluation patient presents today for further treatment and evaluation.  Past Medical History:  Diagnosis Date   Cataract     Objective:  General: Well developed, nourished, in no acute distress, alert and oriented x3   Dermatology: Skin is warm, dry and supple bilateral.  Medial border left great toe appears to be erythematous with evidence of an ingrowing nail. Pain on palpation noted to the border of the nail fold. The remaining nails appear unremarkable at this time. There are no open sores, lesions.  Hyperkeratotic dystrophic discolored thickened nail also noted to the right hallux nail plate consistent with findings of onychomycosis of the toenail  Vascular: Dorsalis Pedis artery and Posterior Tibial artery pedal pulses palpable. No lower extremity edema noted.   Neruologic: Grossly intact via light touch bilateral.  Musculoskeletal: Muscular strength within normal limits in all groups bilateral. Normal range of motion noted to all pedal and ankle joints.   Assesement: #1 Paronychia with ingrowing nail left great toe medial border #2  Onychomycosis of toenails right great toe  Plan of Care:  1. Patient evaluated.  2. Discussed treatment alternatives and plan of care. Explained nail avulsion procedure and post procedure course to patient. 3. Patient opted for permanent partial nail avulsion of the ingrown portion of the nail.  4. Prior to procedure, local anesthesia infiltration utilized using 3 ml of a 50:50 mixture of 2% plain lidocaine and 0.5% plain marcaine in a normal hallux block fashion and a  betadine prep performed.  5. Partial permanent nail avulsion with chemical matrixectomy performed using 3x30sec applications of phenol followed by alcohol flush.  6. Light dressing applied.  Post care instructions provided 7.  Recommend antibiotic ointment and a Band-Aid daily 8.  Regarding the onychomycosis of the toenail, today we discussed different treatment options including oral, topical, and laser antifungal treatment modalities.  After discussing the benefits and disadvantages of each and their associated efficacies the patient opts for oral antifungal medication.  The patient denies a history of liver pathology or symptoms.  She is otherwise healthy.   9.  Prescription for Lamisil 250 mg #90 daily  10.  Return to clinic 2 weeks.  Felecia Shelling, DPM [facility]  Dr. Felecia Shelling, DPM    [address] [place], [place] [address]                Office [phone]  Fax [phone]

## 2021-03-06 DIAGNOSIS — L600 Ingrowing nail: Secondary | ICD-10-CM

## 2021-03-06 HISTORY — DX: Ingrowing nail: L60.0

## 2021-03-09 ENCOUNTER — Ambulatory Visit (INDEPENDENT_AMBULATORY_CARE_PROVIDER_SITE_OTHER): Payer: Managed Care, Other (non HMO) | Admitting: Podiatry

## 2021-03-09 ENCOUNTER — Other Ambulatory Visit: Payer: Self-pay

## 2021-03-09 DIAGNOSIS — L600 Ingrowing nail: Secondary | ICD-10-CM | POA: Diagnosis not present

## 2021-03-09 NOTE — Progress Notes (Signed)
   Subjective: 65 y.o. female presents today status post permanent nail avulsion procedure of the medial border of the left great toe that was performed on 02/16/2021.  Patient states that regarding the ingrown toenail she is doing very well.  It is healing appropriately and she did the Epsom salt soaks and applied antibiotic ointment as instructed.  Patient picked up the prescription for the oral Lamisil but she has not begun taking it yet.  She is scheduled for an annual physical this month with her family physician at and she will ensure that she has a comprehensive metabolic panel with her blood work to check her liver enzymes prior to initiating the Lamisil  Past Medical History:  Diagnosis Date   Cataract     Objective: Skin is warm, dry and supple. Nail and respective nail fold appears to be healing appropriately. Open wound to the associated nail fold with a granular wound base and moderate amount of fibrotic tissue. Minimal drainage noted. Mild erythema around the periungual region likely due to phenol chemical matricectomy.  Assessment: #1 s/p partial permanent nail matrixectomy medial border left great toe   Plan of care: #1 patient was evaluated  #2 light debridement of open wound was performed to the periungual border of the respective toe using a currette. Antibiotic ointment and Band-Aid was applied. #3  Patient will initiate oral Lamisil pending blood work taken at her annual physical this month  #4 patient is to return to clinic on a PRN basis.   Felecia Shelling, DPM [facility]  Dr. Felecia Shelling, DPM    [address] [place], [place] [address]                Office [phone]  Fax [phone]

## 2021-03-22 ENCOUNTER — Encounter: Payer: Self-pay | Admitting: Family Medicine

## 2021-03-22 ENCOUNTER — Other Ambulatory Visit: Payer: Self-pay

## 2021-03-22 ENCOUNTER — Other Ambulatory Visit (HOSPITAL_COMMUNITY)
Admission: RE | Admit: 2021-03-22 | Discharge: 2021-03-22 | Disposition: A | Discharge status: Home / Self Care | Payer: Managed Care, Other (non HMO) | Source: Ambulatory Visit | Attending: Family Medicine | Admitting: Family Medicine

## 2021-03-22 ENCOUNTER — Ambulatory Visit (INDEPENDENT_AMBULATORY_CARE_PROVIDER_SITE_OTHER): Payer: Managed Care, Other (non HMO) | Admitting: Family Medicine

## 2021-03-22 VITALS — BP 117/77 | HR 72 | Ht 68.0 in | Wt 183.0 lb

## 2021-03-22 DIAGNOSIS — Z1231 Encounter for screening mammogram for malignant neoplasm of breast: Secondary | ICD-10-CM | POA: Diagnosis not present

## 2021-03-22 DIAGNOSIS — Z0000 Encounter for general adult medical examination without abnormal findings: Secondary | ICD-10-CM

## 2021-03-22 DIAGNOSIS — Z124 Encounter for screening for malignant neoplasm of cervix: Secondary | ICD-10-CM

## 2021-03-22 DIAGNOSIS — Z23 Encounter for immunization: Secondary | ICD-10-CM

## 2021-03-22 LAB — URINALYSIS, ROUTINE W REFLEX MICROSCOPIC
Bilirubin, UA: NEGATIVE
Glucose, UA: NEGATIVE
Nitrite, UA: NEGATIVE
Protein,UA: NEGATIVE
Specific Gravity, UA: 1.025 (ref 1.005–1.030)
Urobilinogen, Ur: 0.2 mg/dL (ref 0.2–1.0)
pH, UA: 5.5 (ref 5.0–7.5)

## 2021-03-22 LAB — MICROSCOPIC EXAMINATION: Bacteria, UA: NONE SEEN

## 2021-03-22 NOTE — Progress Notes (Signed)
BP 117/77   Pulse 72   Ht '5\' 8"'  (1.727 m)   Wt 183 lb (83 kg)   LMP  (LMP Unknown)   BMI 27.83 kg/m    Subjective:    Patient ID: Yolanda Rodgers, female    DOB: December 28, 1955, 65 y.o.   MRN: [id number]  HPI: Yolanda Rodgers is a 65 y.o. female presenting on 03/22/2021 for comprehensive medical examination. Current medical complaints include:none  Menopausal Symptoms: no  Depression Screen done today and results listed below:  Depression screen [facility] 2/9 03/22/2021 03/22/2021 03/18/2020 03/15/2019 03/07/2018  Decreased Interest 1 1 0 0 0  Down, Depressed, Hopeless 0 0 0 0 1  PHQ - 2 Score 1 1 0 0 1  Altered sleeping 1 1 - - 1  Tired, decreased energy 1 1 - - 1  Change in appetite 0 0 - - 0  Feeling bad or failure about yourself  0 0 - - 0  Trouble concentrating 0 0 - - 0  Moving slowly or fidgety/restless 0 0 - - 0  Suicidal thoughts 0 0 - - 0  PHQ-9 Score 3 3 - - 3    Past Medical History:  Past Medical History:  Diagnosis Date   Cataract    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   Cyanocobalamin (VITAMIN B12 PO) Take 1 tablet by mouth daily.   diclofenac Sodium (VOLTAREN) 1 % GEL Apply 4 g topically 4 (four) times daily.   Glucosamine-Chondroitin (GLUCOSAMINE CHONDR COMPLEX PO) Take 1 tablet by mouth daily.   Multiple Vitamin (MULTIVITAMIN) tablet Take 1 tablet by mouth daily.   Multiple Vitamins-Minerals (ZINC PO) Take by mouth. Pt unsure of dose   Nutritional Supplements (ESTROVEN PO) Take 1 tablet by mouth daily.   prednisoLONE acetate (PRED FORTE) 1 % ophthalmic suspension Place 1 drop into the right eye every  morning.   terbinafine (LAMISIL) 250 MG tablet Take 1 tablet (250 mg total) by mouth daily.   UNABLE TO FIND Take by mouth daily. Equinacea caps   No current facility-administered medications on file prior to visit.    Allergies:  No Known Allergies  Social History:  Social History   Socioeconomic History   Marital status: Married    Spouse name: Not on file   Number of children: Not on file   Years of education: Not on file   Highest education level: Not on file  Occupational History   Not on file  Tobacco Use   Smoking status: Never   Smokeless tobacco: Never  Vaping Use   Vaping Use: Never used  Substance and Sexual Activity   Alcohol use: Yes    Comment: on occasion   Drug use: No   Sexual activity: Never  Other Topics Concern   Not on file  Social History Narrative   Not on file   Social Determinants of Health   Financial Resource Strain: Not on file  Food Insecurity: Not on file  Transportation Needs: Not on file  Physical Activity: Not on file  Stress: Not on file  Social Connections: Not on file  Intimate Partner Violence: Not on file   Social History   Tobacco Use  Smoking Status Never  Smokeless Tobacco Never   Social History   Substance and Sexual Activity  Alcohol Use Yes   Comment: on occasion    Family History:  Family History  Adopted: Yes    Past medical history, surgical history, medications, allergies, family history and social history reviewed with patient today and changes made to appropriate areas of the chart.   Review of Systems  Constitutional: Negative.   HENT: Negative.    Eyes:  Positive for blurred vision. Negative for double vision, photophobia, pain, discharge and redness.  Respiratory: Negative.    Cardiovascular: Negative.   Gastrointestinal: Negative.   Genitourinary: Negative.   Musculoskeletal: Negative.   Skin: Negative.   Neurological: Negative.   Endo/Heme/Allergies: Negative.   Psychiatric/Behavioral:  Negative.    All other ROS negative except what is listed above and in the HPI.      Objective:    BP 117/77   Pulse 72   Ht '5\' 8"'  (1.727 m)   Wt 183 lb (83 kg)   LMP  (LMP Unknown)   BMI 27.83 kg/m   Wt Readings from Last 3 Encounters:  03/22/21 183 lb (83 kg)  01/25/21 181 lb 12.8 oz (82.5 kg)  08/04/20 185 lb 9.6 oz (84.2 kg)    Physical Exam Vitals and nursing note reviewed. Exam conducted with a chaperone present.  Constitutional:      General: She is not in acute distress.    Appearance: Normal appearance. She is normal weight. She is not ill-appearing, toxic-appearing or diaphoretic.  HENT:     Head: Normocephalic and atraumatic.     Right Ear: Tympanic membrane, ear canal and external ear normal. There is no impacted cerumen.     Left Ear: Tympanic membrane, ear canal and external ear normal. There is no impacted cerumen.     Nose: Nose normal. No congestion or rhinorrhea.     Mouth/Throat:     Mouth: Mucous membranes are moist.     Pharynx: Oropharynx is clear. No oropharyngeal exudate or posterior oropharyngeal erythema.  Eyes:     General: No scleral icterus.       Right eye: No discharge.        Left eye: No discharge.     Extraocular Movements: Extraocular movements intact.     Conjunctiva/sclera: Conjunctivae normal.     Pupils: Pupils are equal, round, and reactive to light.  Neck:     Vascular: No carotid bruit.  Cardiovascular:     Rate and Rhythm: Normal rate and regular rhythm.     Pulses: Normal pulses.     Heart sounds: No murmur heard.   No friction rub. No gallop.  Pulmonary:     Effort: Pulmonary effort is normal. No respiratory distress.     Breath sounds: Normal breath sounds. No stridor. No wheezing, rhonchi or rales.  Chest:     Chest wall: No tenderness.  Abdominal:     General: Abdomen is flat. Bowel sounds are normal. There is no distension.     Palpations: Abdomen is soft. There is no mass.     Tenderness: There is no abdominal  tenderness. There is no right CVA tenderness, left CVA tenderness, guarding or rebound.     Hernia: No hernia is present. There is no hernia in the left inguinal area or right inguinal area.  Genitourinary:    Labia:  Right: No rash, tenderness, lesion or injury.        Left: No tenderness, lesion or injury.      Vagina: Normal.     Cervix: Normal.     Uterus: Normal.      Adnexa: Right adnexa normal and left adnexa normal.     Comments: Breast and pelvic exams deferred with shared decision making Musculoskeletal:        General: No swelling, tenderness, deformity or signs of injury.     Cervical back: Normal range of motion and neck supple. No rigidity. No muscular tenderness.     Right lower leg: No edema.     Left lower leg: No edema.  Lymphadenopathy:     Cervical: No cervical adenopathy.     Lower Body: No right inguinal adenopathy. No left inguinal adenopathy.  Skin:    General: Skin is warm and dry.     Capillary Refill: Capillary refill takes less than 2 seconds.     Coloration: Skin is not jaundiced or pale.     Findings: No bruising, erythema, lesion or rash.  Neurological:     General: No focal deficit present.     Mental Status: She is alert and oriented to person, place, and time. Mental status is at baseline.     Cranial Nerves: No cranial nerve deficit.     Sensory: No sensory deficit.     Motor: No weakness.     Coordination: Coordination normal.     Gait: Gait normal.     Deep Tendon Reflexes: Reflexes normal.  Psychiatric:        Mood and Affect: Mood normal.        Behavior: Behavior normal.        Thought Content: Thought content normal.        Judgment: Judgment normal.    Results for orders placed or performed in visit on 08/04/20  CoaguChek XS/INR Waived  Result Value Ref Range   INR 1.2 (H) 0.9 - 1.1   Prothrombin Time 14.3 sec  Comprehensive metabolic panel  Result Value Ref Range   Glucose 80 65 - 99 mg/dL   BUN 15 8 - 27 mg/dL    Creatinine, Ser 0.76 0.57 - 1.00 mg/dL   eGFR 88 >59 mL/min/1.73   BUN/Creatinine Ratio 20 12 - 28   Sodium 143 134 - 144 mmol/L   Potassium 4.3 3.5 - 5.2 mmol/L   Chloride 104 96 - 106 mmol/L   CO2 23 20 - 29 mmol/L   Calcium 10.0 8.7 - 10.3 mg/dL   Total Protein 7.5 6.0 - 8.5 g/dL   Albumin 4.6 3.8 - 4.8 g/dL   Globulin, Total 2.9 1.5 - 4.5 g/dL   Albumin/Globulin Ratio 1.6 1.2 - 2.2   Bilirubin Total 0.3 0.0 - 1.2 mg/dL   Alkaline Phosphatase 66 44 - 121 IU/L   AST 15 0 - 40 IU/L   ALT 13 0 - 32 IU/L  CBC with Differential/Platelet  Result Value Ref Range   WBC 4.8 3.4 - 10.8 x10E3/uL   RBC 4.38 3.77 - 5.28 x10E6/uL   Hemoglobin 12.5 11.1 - 15.9 g/dL   Hematocrit 38.7 34.0 - 46.6 %   MCV 88 79 - 97 fL   MCH 28.5 26.6 - 33.0 pg   MCHC 32.3 31.5 - 35.7 g/dL   RDW 12.9 11.7 - 15.4 %   Platelets 355 150 - 450 x10E3/uL   Neutrophils 44 Not Estab. %   Lymphs 42  Not Estab. %   Monocytes 8 Not Estab. %   Eos 4 Not Estab. %   Basos 2 Not Estab. %   Neutrophils Absolute 2.1 1.4 - 7.0 x10E3/uL   Lymphocytes Absolute 2.0 0.7 - 3.1 x10E3/uL   Monocytes Absolute 0.4 0.1 - 0.9 x10E3/uL   EOS (ABSOLUTE) 0.2 0.0 - 0.4 x10E3/uL   Basophils Absolute 0.1 0.0 - 0.2 x10E3/uL   Immature Granulocytes 0 Not Estab. %   Immature Grans (Abs) 0.0 0.0 - 0.1 x10E3/uL  Hgb A1c w/o eAG  Result Value Ref Range   Hgb A1c MFr Bld 5.6 4.8 - 5.6 %      Assessment & Plan:   Problem List Items Addressed This Visit   None Visit Diagnoses     Routine general medical examination at a health care facility    -  Primary   Vaccines up to date. Screening labs checked today. Pap done. Mammogram ordered. Cologuard up to date. Continue diet and exercise. Call with any concerns.    Relevant Orders   CBC with Differential/Platelet   Comprehensive metabolic panel   Lipid Panel w/o Chol/HDL Ratio   Urinalysis, Routine w reflex microscopic   TSH   Screening for cervical cancer       Pap done today. Await  results.    Relevant Orders   Cytology - PAP   Encounter for screening mammogram for malignant neoplasm of breast       Mammogram ordered today.   Relevant Orders   MM DIAG BREAST TOMO BILATERAL        Follow up plan: Return in about 1 year (around 03/22/2022), or physical.   LABORATORY TESTING:  - Pap smear: pap done  IMMUNIZATIONS:   - Tdap: Tetanus vaccination status reviewed: last tetanus booster within 10 years. - Influenza: Administered today - Pneumovax: Not applicable - Prevnar: Not applicable - COVID: Up to date - Zostavax vaccine: Refused  SCREENING: -Mammogram: Ordered today  - Colonoscopy: Up to date   PATIENT COUNSELING:   Advised to take 1 mg of folate supplement per day if capable of pregnancy.   Sexuality: Discussed sexually transmitted diseases, partner selection, use of condoms, avoidance of unintended pregnancy  and contraceptive alternatives.   Advised to avoid cigarette smoking.  I discussed with the patient that most people either abstain from alcohol or drink within safe limits (<=14/week and <=4 drinks/occasion for males, <=7/weeks and <= 3 drinks/occasion for females) and that the risk for alcohol disorders and other health effects rises proportionally with the number of drinks per week and how often a drinker exceeds daily limits.  Discussed cessation/primary prevention of drug use and availability of treatment for abuse.   Diet: Encouraged to adjust caloric intake to maintain  or achieve ideal body weight, to reduce intake of dietary saturated fat and total fat, to limit sodium intake by avoiding high sodium foods and not adding table salt, and to maintain adequate dietary potassium and calcium preferably from fresh fruits, vegetables, and low-fat dairy products.    stressed the importance of regular exercise  Injury prevention: Discussed safety belts, safety helmets, smoke detector, smoking near bedding or upholstery.   Dental health:  Discussed importance of regular tooth brushing, flossing, and dental visits.    NEXT PREVENTATIVE PHYSICAL DUE IN 1 YEAR. Return in about 1 year (around 03/22/2022), or physical.

## 2021-03-23 LAB — COMPREHENSIVE METABOLIC PANEL
ALT: 12 IU/L (ref 0–32)
AST: 14 IU/L (ref 0–40)
Albumin/Globulin Ratio: 1.7 (ref 1.2–2.2)
Albumin: 4.5 g/dL (ref 3.8–4.8)
Alkaline Phosphatase: 70 IU/L (ref 44–121)
BUN/Creatinine Ratio: 24 (ref 12–28)
BUN: 16 mg/dL (ref 8–27)
Bilirubin Total: 0.3 mg/dL (ref 0.0–1.2)
CO2: 22 mmol/L (ref 20–29)
Calcium: 9.3 mg/dL (ref 8.7–10.3)
Chloride: 101 mmol/L (ref 96–106)
Creatinine, Ser: 0.67 mg/dL (ref 0.57–1.00)
Globulin, Total: 2.7 g/dL (ref 1.5–4.5)
Glucose: 76 mg/dL (ref 70–99)
Potassium: 4.2 mmol/L (ref 3.5–5.2)
Sodium: 140 mmol/L (ref 134–144)
Total Protein: 7.2 g/dL (ref 6.0–8.5)
eGFR: 98 mL/min/{1.73_m2} (ref >59)

## 2021-03-23 LAB — LIPID PANEL W/O CHOL/HDL RATIO
Cholesterol, Total: 247 mg/dL — ABNORMAL HIGH (ref 100–199)
HDL: 60 mg/dL (ref >39)
LDL Chol Calc (NIH): 175 mg/dL — ABNORMAL HIGH (ref 0–99)
Triglycerides: 71 mg/dL (ref 0–149)
VLDL Cholesterol Cal: 12 mg/dL (ref 5–40)

## 2021-03-23 LAB — CBC WITH DIFFERENTIAL/PLATELET
Basophils Absolute: 0.1 10*3/uL (ref 0.0–0.2)
Basos: 1 %
EOS (ABSOLUTE): 0.2 10*3/uL (ref 0.0–0.4)
Eos: 4 %
Hematocrit: 36 % (ref 34.0–46.6)
Hemoglobin: 11.5 g/dL (ref 11.1–15.9)
Immature Grans (Abs): 0 10*3/uL (ref 0.0–0.1)
Immature Granulocytes: 0 %
Lymphocytes Absolute: 2.3 10*3/uL (ref 0.7–3.1)
Lymphs: 44 %
MCH: 28 pg (ref 26.6–33.0)
MCHC: 31.9 g/dL (ref 31.5–35.7)
MCV: 88 fL (ref 79–97)
Monocytes Absolute: 0.4 10*3/uL (ref 0.1–0.9)
Monocytes: 8 %
Neutrophils Absolute: 2.2 10*3/uL (ref 1.4–7.0)
Neutrophils: 43 %
Platelets: 339 10*3/uL (ref 150–450)
RBC: 4.1 x10E6/uL (ref 3.77–5.28)
RDW: 13.1 % (ref 11.7–15.4)
WBC: 5.2 10*3/uL (ref 3.4–10.8)

## 2021-03-23 LAB — TSH: TSH: 2.44 u[IU]/mL (ref 0.450–4.500)

## 2021-03-25 LAB — CYTOLOGY - PAP
Comment: NEGATIVE
Diagnosis: NEGATIVE
High risk HPV: NEGATIVE

## 2021-05-06 HISTORY — PX: TOTAL HIP ARTHROPLASTY: SHX124

## 2021-09-06 ENCOUNTER — Other Ambulatory Visit: Payer: Self-pay | Admitting: Family Medicine

## 2021-09-06 DIAGNOSIS — Z1231 Encounter for screening mammogram for malignant neoplasm of breast: Secondary | ICD-10-CM

## 2021-10-12 ENCOUNTER — Ambulatory Visit
Admission: RE | Admit: 2021-10-12 | Discharge: 2021-10-12 | Disposition: A | Discharge status: Home / Self Care | Payer: Managed Care, Other (non HMO) | Source: Ambulatory Visit | Attending: Family Medicine | Admitting: Family Medicine

## 2021-10-12 DIAGNOSIS — Z1231 Encounter for screening mammogram for malignant neoplasm of breast: Secondary | ICD-10-CM | POA: Insufficient documentation

## 2021-10-19 ENCOUNTER — Inpatient Hospital Stay
Admission: RE | Admit: 2021-10-19 | Discharge: 2021-10-19 | Disposition: A | Discharge status: Home / Self Care | Payer: Self-pay | Source: Ambulatory Visit | Attending: *Deleted | Admitting: *Deleted

## 2021-10-19 ENCOUNTER — Other Ambulatory Visit: Payer: Self-pay | Admitting: *Deleted

## 2021-10-19 DIAGNOSIS — Z1231 Encounter for screening mammogram for malignant neoplasm of breast: Secondary | ICD-10-CM

## 2022-03-23 ENCOUNTER — Encounter: Payer: Self-pay | Admitting: Family Medicine

## 2022-03-23 ENCOUNTER — Ambulatory Visit (INDEPENDENT_AMBULATORY_CARE_PROVIDER_SITE_OTHER): Payer: Managed Care, Other (non HMO) | Admitting: Family Medicine

## 2022-03-23 VITALS — BP 106/68 | HR 66 | Temp 98.2°F | Ht 69.0 in | Wt 189.0 lb

## 2022-03-23 DIAGNOSIS — Z1231 Encounter for screening mammogram for malignant neoplasm of breast: Secondary | ICD-10-CM

## 2022-03-23 DIAGNOSIS — Z23 Encounter for immunization: Secondary | ICD-10-CM | POA: Diagnosis not present

## 2022-03-23 DIAGNOSIS — Z0000 Encounter for general adult medical examination without abnormal findings: Secondary | ICD-10-CM

## 2022-03-23 LAB — URINALYSIS, ROUTINE W REFLEX MICROSCOPIC
Bilirubin, UA: NEGATIVE
Glucose, UA: NEGATIVE
Ketones, UA: NEGATIVE
Leukocytes,UA: NEGATIVE
Nitrite, UA: NEGATIVE
Protein,UA: NEGATIVE
Specific Gravity, UA: 1.02 (ref 1.005–1.030)
Urobilinogen, Ur: 0.2 mg/dL (ref 0.2–1.0)
pH, UA: 6.5 (ref 5.0–7.5)

## 2022-03-23 LAB — MICROSCOPIC EXAMINATION
Bacteria, UA: NONE SEEN
WBC, UA: NONE SEEN /hpf (ref 0–5)

## 2022-03-23 NOTE — Progress Notes (Signed)
BP 106/68   Pulse 66   Temp 98.2 F (36.8 C)   Ht 5\' 9"  (1.753 m)   Wt 189 lb (85.7 kg)   LMP  (LMP Unknown)   SpO2 97%   BMI 27.91 kg/m    Subjective:    Patient ID: , female    DOB: 06/17/1955, 66 y.o.   MRN: [id number]  HPI: Yolanda Rodgers is a 66 y.o. female presenting on 03/23/2022 for comprehensive medical examination. Current medical complaints include:none  Menopausal Symptoms: no  Depression Screen done today and results listed below:     03/23/2022    9:11 AM 03/22/2021    3:39 PM 03/22/2021    3:25 PM 03/18/2020   11:28 AM 03/15/2019    2:16 PM  Depression screen PHQ 2/9  Decreased Interest 0 1 1 0 0  Down, Depressed, Hopeless 0 0 0 0 0  PHQ - 2 Score 0 1 1 0 0  Altered sleeping 1 1 1     Tired, decreased energy 1 1 1     Change in appetite 0 0 0    Feeling bad or failure about yourself  0 0 0    Trouble concentrating 0 0 0    Moving slowly or fidgety/restless 0 0 0    Suicidal thoughts 0 0 0    PHQ-9 Score 2 3 3       Past Medical History:  Past Medical History:  Diagnosis Date   Cataract    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   Calcium Carbonate-Vitamin D (CALCIUM-VITAMIN D3 PO) Take 1 tablet by mouth daily.   Cyanocobalamin (VITAMIN B12 PO) Take 1 tablet by mouth daily.   diclofenac Sodium (VOLTAREN) 1 % GEL Apply 4 g topically 4 (four) times daily.   ELDERBERRY PO Take by mouth.   Glucosamine-Chondroitin (GLUCOSAMINE CHONDR COMPLEX PO) Take 1 tablet by mouth daily.   Multiple Vitamin (MULTIVITAMIN) tablet Take 1 tablet by mouth daily.    Multiple Vitamins-Minerals (ZINC PO) Take by mouth. Pt unsure of dose   Nutritional Supplements (ESTROVEN PO) Take 1 tablet by mouth daily.   prednisoLONE acetate (PRED FORTE) 1 % ophthalmic suspension Place 1 drop into the right eye every morning.   No current facility-administered medications on file prior to visit.    Allergies:  No Known Allergies  Social History:  Social History   Socioeconomic History   Marital status: Married    Spouse name: Not on file   Number of children: Not on file   Years of education: Not on file   Highest education level: Not on file  Occupational History   Not on file  Tobacco Use   Smoking status: Never   Smokeless tobacco: Never  Vaping Use   Vaping Use: Never used  Substance and Sexual Activity   Alcohol use: Yes    Comment: on occasion   Drug use: No   Sexual activity: Never  Other Topics Concern   Not on file  Social History Narrative   Not on file  Social Determinants of Health   Financial Resource Strain: Not on file  Food Insecurity: Not on file  Transportation Needs: Not on file  Physical Activity: Not on file  Stress: Not on file  Social Connections: Not on file  Intimate Partner Violence: Not on file   Social History   Tobacco Use  Smoking Status Never  Smokeless Tobacco Never   Social History   Substance and Sexual Activity  Alcohol Use Yes   Comment: on occasion    Family History:  Family History  Adopted: Yes    Past medical history, surgical history, medications, allergies, family history and social history reviewed with patient today and changes made to appropriate areas of the chart.   Review of Systems  Constitutional: Negative.   HENT: Negative.    Eyes:  Positive for blurred vision (L eye needs cataract surgery). Negative for double vision, photophobia, pain, discharge and redness.  Respiratory: Negative.    Cardiovascular: Negative.   Gastrointestinal: Negative.   Genitourinary: Negative.    Musculoskeletal: Negative.   Skin: Negative.   Neurological: Negative.   Endo/Heme/Allergies:  Positive for polydipsia. Negative for environmental allergies. Does not bruise/bleed easily.  Psychiatric/Behavioral: Negative.     All other ROS negative except what is listed above and in the HPI.      Objective:    BP 106/68   Pulse 66   Temp 98.2 F (36.8 C)   Ht 5\' 9"  (1.753 m)   Wt 189 lb (85.7 kg)   LMP  (LMP Unknown)   SpO2 97%   BMI 27.91 kg/m   Wt Readings from Last 3 Encounters:  03/23/22 189 lb (85.7 kg)  03/22/21 183 lb (83 kg)  01/25/21 181 lb 12.8 oz (82.5 kg)    Physical Exam Vitals and nursing note reviewed.  Constitutional:      General: She is not in acute distress.    Appearance: Normal appearance. She is normal weight. She is not ill-appearing, toxic-appearing or diaphoretic.  HENT:     Head: Normocephalic and atraumatic.     Right Ear: Tympanic membrane, ear canal and external ear normal. There is no impacted cerumen.     Left Ear: Tympanic membrane, ear canal and external ear normal. There is no impacted cerumen.     Nose: Nose normal. No congestion or rhinorrhea.     Mouth/Throat:     Mouth: Mucous membranes are moist.     Pharynx: Oropharynx is clear. No oropharyngeal exudate or posterior oropharyngeal erythema.  Eyes:     General: No scleral icterus.       Right eye: No discharge.        Left eye: No discharge.     Extraocular Movements: Extraocular movements intact.     Conjunctiva/sclera: Conjunctivae normal.     Pupils: Pupils are equal, round, and reactive to light.  Neck:     Vascular: No carotid bruit.  Cardiovascular:     Rate and Rhythm: Normal rate and regular rhythm.     Pulses: Normal pulses.     Heart sounds: No murmur heard.    No friction rub. No gallop.  Pulmonary:     Effort: Pulmonary effort is normal. No respiratory distress.     Breath sounds: Normal breath sounds. No stridor. No wheezing, rhonchi or rales.  Chest:      Chest wall: No tenderness.  Abdominal:     General: Abdomen is flat. Bowel sounds are normal. There is no distension.  Palpations: Abdomen is soft. There is no mass.     Tenderness: There is no abdominal tenderness. There is no right CVA tenderness, left CVA tenderness, guarding or rebound.     Hernia: No hernia is present.  Genitourinary:    Comments: Breast and pelvic exams deferred with shared decision making Musculoskeletal:        General: No swelling, tenderness, deformity or signs of injury.     Cervical back: Normal range of motion and neck supple. No rigidity. No muscular tenderness.     Right lower leg: No edema.     Left lower leg: No edema.  Lymphadenopathy:     Cervical: No cervical adenopathy.  Skin:    General: Skin is warm and dry.     Capillary Refill: Capillary refill takes less than 2 seconds.     Coloration: Skin is not jaundiced or pale.     Findings: No bruising, erythema, lesion or rash.  Neurological:     General: No focal deficit present.     Mental Status: She is alert and oriented to person, place, and time. Mental status is at baseline.     Cranial Nerves: No cranial nerve deficit.     Sensory: No sensory deficit.     Motor: No weakness.     Coordination: Coordination normal.     Gait: Gait normal.     Deep Tendon Reflexes: Reflexes normal.  Psychiatric:        Mood and Affect: Mood normal.        Behavior: Behavior normal.        Thought Content: Thought content normal.        Judgment: Judgment normal.     Results for orders placed or performed in visit on 03/23/22  Microscopic Examination   Urine  Result Value Ref Range   WBC, UA None seen 0 - 5 /hpf   RBC, Urine 0-2 0 - 2 /hpf   Epithelial Cells (non renal) 0-10 0 - 10 /hpf   Bacteria, UA None seen None seen/Few  Urinalysis, Routine w reflex microscopic  Result Value Ref Range   Specific Gravity, UA 1.020 1.005 - 1.030   pH, UA 6.5 5.0 - 7.5   Color, UA Yellow Yellow   Appearance Ur  Clear Clear   Leukocytes,UA Negative Negative   Protein,UA Negative Negative/Trace   Glucose, UA Negative Negative   Ketones, UA Negative Negative   RBC, UA Trace (A) Negative   Bilirubin, UA Negative Negative   Urobilinogen, Ur 0.2 0.2 - 1.0 mg/dL   Nitrite, UA Negative Negative   Microscopic Examination See below:       Assessment & Plan:   Problem List Items Addressed This Visit   None Visit Diagnoses     Routine general medical examination at a health care facility    -  Primary   Vaccines up to date. Screening labs checked today. Pap and colonscopy up to date. Mammo ordered. Continue diet and exercise. Call with any concerns.    Relevant Orders   CBC with Differential/Platelet   Comprehensive metabolic panel   Lipid Panel w/o Chol/HDL Ratio   Urinalysis, Routine w reflex microscopic (Completed)   TSH   Need for influenza vaccination       Relevant Orders   Flu Vaccine QUAD 6+ mos PF IM (Fluarix Quad PF) (Completed)   Encounter for screening mammogram for malignant neoplasm of breast       Relevant Orders   MM 3D SCREEN  BREAST BILATERAL        Follow up plan: Return in about 1 year (around 03/24/2023) for physical.   LABORATORY TESTING:  - Pap smear: up to date  IMMUNIZATIONS:   - Tdap: Tetanus vaccination status reviewed: last tetanus booster within 10 years. - Influenza: Administered today - Pneumovax: Not applicable - Prevnar: Not applicable - COVID: Up to date - HPV: Not applicable - Shingrix vaccine: Refused  SCREENING: -Mammogram: Up to date  - Colonoscopy: Up to date  - Bone Density: Not applicable   PATIENT COUNSELING:   Advised to take 1 mg of folate supplement per day if capable of pregnancy.   Sexuality: Discussed sexually transmitted diseases, partner selection, use of condoms, avoidance of unintended pregnancy  and contraceptive alternatives.   Advised to avoid cigarette smoking.  I discussed with the patient that most people either  abstain from alcohol or drink within safe limits (<=14/week and <=4 drinks/occasion for males, <=7/weeks and <= 3 drinks/occasion for females) and that the risk for alcohol disorders and other health effects rises proportionally with the number of drinks per week and how often a drinker exceeds daily limits.  Discussed cessation/primary prevention of drug use and availability of treatment for abuse.   Diet: Encouraged to adjust caloric intake to maintain  or achieve ideal body weight, to reduce intake of dietary saturated fat and total fat, to limit sodium intake by avoiding high sodium foods and not adding table salt, and to maintain adequate dietary potassium and calcium preferably from fresh fruits, vegetables, and low-fat dairy products.    stressed the importance of regular exercise  Injury prevention: Discussed safety belts, safety helmets, smoke detector, smoking near bedding or upholstery.   Dental health: Discussed importance of regular tooth brushing, flossing, and dental visits.    NEXT PREVENTATIVE PHYSICAL DUE IN 1 YEAR. Return in about 1 year (around 03/24/2023) for physical.

## 2022-03-23 NOTE — Patient Instructions (Signed)
Please call to schedule your mammogram for May: [facility]  Address: [address], [place], [place] [address] Phone: [phone]  [facility] [address] [place],  [place]  [address] Phone: [phone]

## 2022-03-24 LAB — CBC WITH DIFFERENTIAL/PLATELET
Basophils Absolute: 0.1 10*3/uL (ref 0.0–0.2)
Basos: 1 %
EOS (ABSOLUTE): 0.2 10*3/uL (ref 0.0–0.4)
Eos: 5 %
Hematocrit: 37.4 % (ref 34.0–46.6)
Hemoglobin: 11.8 g/dL (ref 11.1–15.9)
Immature Grans (Abs): 0 10*3/uL (ref 0.0–0.1)
Immature Granulocytes: 0 %
Lymphocytes Absolute: 2.1 10*3/uL (ref 0.7–3.1)
Lymphs: 43 %
MCH: 28.7 pg (ref 26.6–33.0)
MCHC: 31.6 g/dL (ref 31.5–35.7)
MCV: 91 fL (ref 79–97)
Monocytes Absolute: 0.4 10*3/uL (ref 0.1–0.9)
Monocytes: 8 %
Neutrophils Absolute: 2 10*3/uL (ref 1.4–7.0)
Neutrophils: 43 %
Platelets: 343 10*3/uL (ref 150–450)
RBC: 4.11 x10E6/uL (ref 3.77–5.28)
RDW: 13.1 % (ref 11.7–15.4)
WBC: 4.8 10*3/uL (ref 3.4–10.8)

## 2022-03-24 LAB — COMPREHENSIVE METABOLIC PANEL
ALT: 12 IU/L (ref 0–32)
AST: 14 IU/L (ref 0–40)
Albumin/Globulin Ratio: 1.6 (ref 1.2–2.2)
Albumin: 4.4 g/dL (ref 3.9–4.9)
Alkaline Phosphatase: 75 IU/L (ref 44–121)
BUN/Creatinine Ratio: 20 (ref 12–28)
BUN: 14 mg/dL (ref 8–27)
Bilirubin Total: 0.4 mg/dL (ref 0.0–1.2)
CO2: 26 mmol/L (ref 20–29)
Calcium: 9.7 mg/dL (ref 8.7–10.3)
Chloride: 103 mmol/L (ref 96–106)
Creatinine, Ser: 0.7 mg/dL (ref 0.57–1.00)
Globulin, Total: 2.7 g/dL (ref 1.5–4.5)
Glucose: 83 mg/dL (ref 70–99)
Potassium: 4 mmol/L (ref 3.5–5.2)
Sodium: 140 mmol/L (ref 134–144)
Total Protein: 7.1 g/dL (ref 6.0–8.5)
eGFR: 97 mL/min/{1.73_m2} (ref >59)

## 2022-03-24 LAB — LIPID PANEL W/O CHOL/HDL RATIO
Cholesterol, Total: 257 mg/dL — ABNORMAL HIGH (ref 100–199)
HDL: 64 mg/dL (ref >39)
LDL Chol Calc (NIH): 184 mg/dL — ABNORMAL HIGH (ref 0–99)
Triglycerides: 59 mg/dL (ref 0–149)
VLDL Cholesterol Cal: 9 mg/dL (ref 5–40)

## 2022-03-24 LAB — TSH: TSH: 3.06 u[IU]/mL (ref 0.450–4.500)

## 2022-10-14 ENCOUNTER — Ambulatory Visit
Admission: RE | Admit: 2022-10-14 | Discharge: 2022-10-14 | Disposition: A | Discharge status: Home / Self Care | Payer: Managed Care, Other (non HMO) | Source: Ambulatory Visit | Attending: Family Medicine | Admitting: Family Medicine

## 2022-10-14 DIAGNOSIS — Z1231 Encounter for screening mammogram for malignant neoplasm of breast: Secondary | ICD-10-CM | POA: Diagnosis present

## 2023-03-27 ENCOUNTER — Encounter: Payer: Managed Care, Other (non HMO) | Admitting: Family Medicine

## 2023-03-28 ENCOUNTER — Encounter: Payer: Self-pay | Admitting: Family Medicine

## 2023-03-28 ENCOUNTER — Ambulatory Visit: Payer: Managed Care, Other (non HMO) | Admitting: Family Medicine

## 2023-03-28 VITALS — BP 96/63 | HR 64 | Ht 68.0 in | Wt 188.2 lb

## 2023-03-28 DIAGNOSIS — Z0000 Encounter for general adult medical examination without abnormal findings: Secondary | ICD-10-CM

## 2023-03-28 DIAGNOSIS — Z1231 Encounter for screening mammogram for malignant neoplasm of breast: Secondary | ICD-10-CM

## 2023-03-28 DIAGNOSIS — Z23 Encounter for immunization: Secondary | ICD-10-CM | POA: Diagnosis not present

## 2023-03-28 DIAGNOSIS — Z780 Asymptomatic menopausal state: Secondary | ICD-10-CM

## 2023-03-28 DIAGNOSIS — Z1211 Encounter for screening for malignant neoplasm of colon: Secondary | ICD-10-CM | POA: Diagnosis not present

## 2023-03-28 NOTE — Patient Instructions (Signed)
Please call to schedule your mammogram and/or bone density: [facility]  Address: [address], [place], [place] [address] Phone: [phone]  [facility] [place] [address] [place],  [place]  [address] Phone: [phone]   

## 2023-03-28 NOTE — Addendum Note (Signed)
Addended by: Dorcas Carrow on: 03/28/2023 02:53 PM   Modules accepted: Orders

## 2023-03-28 NOTE — Addendum Note (Signed)
Addended by: Malen Gauze on: 03/28/2023 02:55 PM   Modules accepted: Orders

## 2023-03-28 NOTE — Progress Notes (Signed)
BP 96/63   Pulse 64   Ht 5\' 8"  (1.727 m)   Wt 188 lb 3.2 oz (85.4 kg)   LMP  (LMP Unknown)   SpO2 98%   BMI 28.62 kg/m    Subjective:    Patient ID: Yolanda Rodgers, female    DOB: 1956/01/17, 67 y.o.   MRN: [id number]  HPI: Yolanda Rodgers is a 67 y.o. female presenting on 03/28/2023 for comprehensive medical examination. Current medical complaints include:none  She currently lives with: husband Menopausal Symptoms: no  Depression Screen done today and results listed below:     03/28/2023    2:30 PM 03/23/2022    9:11 AM 03/22/2021    3:39 PM 03/22/2021    3:25 PM 03/18/2020   11:28 AM  Depression screen PHQ 2/9  Decreased Interest 0 0 1 1 0  Down, Depressed, Hopeless 0 0 0 0 0  PHQ - 2 Score 0 0 1 1 0  Altered sleeping  1 1 1    Tired, decreased energy  1 1 1    Change in appetite  0 0 0   Feeling bad or failure about yourself   0 0 0   Trouble concentrating  0 0 0   Moving slowly or fidgety/restless  0 0 0   Suicidal thoughts  0 0 0   PHQ-9 Score  2 3 3      Past Medical History:  Past Medical History:  Diagnosis Date   Cataract    History of right hip replacement 08/2020   Ingrown toenail 03/2021    Surgical History:  Past Surgical History:  Procedure Laterality Date   BREAST BIOPSY Right 1999   negative per patient   DILATION AND CURETTAGE OF UTERUS     EYE SURGERY  on file   JOINT REPLACEMENT     TOTAL HIP ARTHROPLASTY Right 05/06/2021   TOTAL HIP ARTHROPLASTY Left 05/2021    Medications:  Current Outpatient Medications on File Prior to Visit  Medication Sig   Biotin 10 MG CAPS Take by mouth.   Calcium Carbonate-Vitamin D (CALCIUM-VITAMIN D3 PO) Take 1 tablet by mouth daily.   Cyanocobalamin (VITAMIN B12 PO) Take 1 tablet by mouth daily.   ELDERBERRY PO Take by mouth.   Glucosamine-Chondroitin (GLUCOSAMINE CHONDR COMPLEX PO) Take 1 tablet by mouth daily.   Multiple Vitamin (MULTIVITAMIN) tablet Take 1 tablet by mouth daily.   Multiple  Vitamins-Minerals (ZINC PO) Take by mouth. Pt unsure of dose   Nutritional Supplements (ESTROVEN PO) Take 1 tablet by mouth daily.   prednisoLONE acetate (PRED FORTE) 1 % ophthalmic suspension Place 1 drop into the right eye every morning.   No current facility-administered medications on file prior to visit.    Allergies:  No Known Allergies  Social History:  Social History   Socioeconomic History   Marital status: Married    Spouse name: Not on file   Number of children: Not on file   Years of education: Not on file   Highest education level: Not on file  Occupational History   Not on file  Tobacco Use   Smoking status: Never   Smokeless tobacco: Never  Vaping Use   Vaping status: Never Used  Substance and Sexual Activity   Alcohol use: Yes    Comment: on occasion   Drug use: No   Sexual activity: Yes    Birth control/protection: None  Other Topics Concern   Not on file  Social History Narrative   Not on  file   Social Determinants of Health   Financial Resource Strain: Low Risk  (03/28/2023)   Overall Financial Resource Strain (CARDIA)    Difficulty of Paying Living Expenses: Not very hard  Food Insecurity: No Food Insecurity (03/28/2023)   Hunger Vital Sign    Worried About Running Out of Food in the Last Year: Never true    Ran Out of Food in the Last Year: Never true  Transportation Needs: Patient Declined (03/28/2023)   PRAPARE - Transportation    Lack of Transportation (Medical): Patient declined    Lack of Transportation (Non-Medical): Patient declined  Physical Activity: Insufficiently Active (03/28/2023)   Exercise Vital Sign    Days of Exercise per Week: 2 days    Minutes of Exercise per Session: 60 min  Stress: Stress Concern Present (03/28/2023)   [employer] of Occupational Health - Occupational Stress Questionnaire    Feeling of Stress : To some extent  Social Connections: Socially Integrated (03/28/2023)   Social Connection and Isolation  Panel [NHANES]    Frequency of Communication with Friends and Family: Twice a week    Frequency of Social Gatherings with Friends and Family: Twice a week    Attends Religious Services: More than 4 times per year    Active Member of [employer] or Organizations: Yes    Attends Engineer, structural: More than 4 times per year    Marital Status: Married  Catering manager Violence: Not on file   Social History   Tobacco Use  Smoking Status Never  Smokeless Tobacco Never   Social History   Substance and Sexual Activity  Alcohol Use Yes   Comment: on occasion    Family History:  Family History  Adopted: Yes    Past medical history, surgical history, medications, allergies, family history and social history reviewed with patient today and changes made to appropriate areas of the chart.   Review of Systems  Constitutional:  Positive for diaphoresis. Negative for chills, fever, malaise/fatigue and weight loss.  HENT: Negative.    Eyes:  Positive for blurred vision. Negative for double vision, photophobia, pain, discharge and redness.  Respiratory: Negative.    Cardiovascular:  Positive for palpitations (with working). Negative for chest pain, orthopnea, claudication, leg swelling and PND.  Gastrointestinal: Negative.   Genitourinary: Negative.   Musculoskeletal: Negative.   Skin: Negative.   Neurological: Negative.   Endo/Heme/Allergies: Negative.   Psychiatric/Behavioral: Negative.     All other ROS negative except what is listed above and in the HPI.      Objective:    BP 96/63   Pulse 64   Ht 5\' 8"  (1.727 m)   Wt 188 lb 3.2 oz (85.4 kg)   LMP  (LMP Unknown)   SpO2 98%   BMI 28.62 kg/m   Wt Readings from Last 3 Encounters:  03/28/23 188 lb 3.2 oz (85.4 kg)  03/23/22 189 lb (85.7 kg)  03/22/21 183 lb (83 kg)    Physical Exam Vitals and nursing note reviewed.  Constitutional:      General: She is not in acute distress.    Appearance: Normal appearance. She  is not ill-appearing, toxic-appearing or diaphoretic.  HENT:     Head: Normocephalic and atraumatic.     Right Ear: Tympanic membrane, ear canal and external ear normal. There is no impacted cerumen.     Left Ear: Tympanic membrane, ear canal and external ear normal. There is no impacted cerumen.     Nose: Nose normal.  No congestion or rhinorrhea.     Mouth/Throat:     Mouth: Mucous membranes are moist.     Pharynx: Oropharynx is clear. No oropharyngeal exudate or posterior oropharyngeal erythema.  Eyes:     General: No scleral icterus.       Right eye: No discharge.        Left eye: No discharge.     Extraocular Movements: Extraocular movements intact.     Conjunctiva/sclera: Conjunctivae normal.     Pupils: Pupils are equal, round, and reactive to light.  Neck:     Vascular: No carotid bruit.  Cardiovascular:     Rate and Rhythm: Normal rate and regular rhythm.     Pulses: Normal pulses.     Heart sounds: No murmur heard.    No friction rub. No gallop.  Pulmonary:     Effort: Pulmonary effort is normal. No respiratory distress.     Breath sounds: Normal breath sounds. No stridor. No wheezing, rhonchi or rales.  Chest:     Chest wall: No tenderness.  Abdominal:     General: Abdomen is flat. Bowel sounds are normal. There is no distension.     Palpations: Abdomen is soft. There is no mass.     Tenderness: There is no abdominal tenderness. There is no right CVA tenderness, left CVA tenderness, guarding or rebound.     Hernia: No hernia is present.  Genitourinary:    Comments: Breast and pelvic exams deferred with shared decision making Musculoskeletal:        General: No swelling, tenderness, deformity or signs of injury.     Cervical back: Normal range of motion and neck supple. No rigidity. No muscular tenderness.     Right lower leg: No edema.     Left lower leg: No edema.  Lymphadenopathy:     Cervical: No cervical adenopathy.  Skin:    General: Skin is warm and dry.      Capillary Refill: Capillary refill takes less than 2 seconds.     Coloration: Skin is not jaundiced or pale.     Findings: No bruising, erythema, lesion or rash.  Neurological:     General: No focal deficit present.     Mental Status: She is alert and oriented to person, place, and time. Mental status is at baseline.     Cranial Nerves: No cranial nerve deficit.     Sensory: No sensory deficit.     Motor: No weakness.     Coordination: Coordination normal.     Gait: Gait normal.     Deep Tendon Reflexes: Reflexes normal.  Psychiatric:        Mood and Affect: Mood normal.        Behavior: Behavior normal.        Thought Content: Thought content normal.        Judgment: Judgment normal.     Results for orders placed or performed in visit on 03/23/22  Microscopic Examination   Urine  Result Value Ref Range   WBC, UA None seen 0 - 5 /hpf   RBC, Urine 0-2 0 - 2 /hpf   Epithelial Cells (non renal) 0-10 0 - 10 /hpf   Bacteria, UA None seen None seen/Few  CBC with Differential/Platelet  Result Value Ref Range   WBC 4.8 3.4 - 10.8 x10E3/uL   RBC 4.11 3.77 - 5.28 x10E6/uL   Hemoglobin 11.8 11.1 - 15.9 g/dL   Hematocrit 62.1 30.8 - 46.6 %   MCV  91 79 - 97 fL   MCH 28.7 26.6 - 33.0 pg   MCHC 31.6 31.5 - 35.7 g/dL   RDW 57.8 46.9 - 62.9 %   Platelets 343 150 - 450 x10E3/uL   Neutrophils 43 Not Estab. %   Lymphs 43 Not Estab. %   Monocytes 8 Not Estab. %   Eos 5 Not Estab. %   Basos 1 Not Estab. %   Neutrophils Absolute 2.0 1.4 - 7.0 x10E3/uL   Lymphocytes Absolute 2.1 0.7 - 3.1 x10E3/uL   Monocytes Absolute 0.4 0.1 - 0.9 x10E3/uL   EOS (ABSOLUTE) 0.2 0.0 - 0.4 x10E3/uL   Basophils Absolute 0.1 0.0 - 0.2 x10E3/uL   Immature Granulocytes 0 Not Estab. %   Immature Grans (Abs) 0.0 0.0 - 0.1 x10E3/uL  Comprehensive metabolic panel  Result Value Ref Range   Glucose 83 70 - 99 mg/dL   BUN 14 8 - 27 mg/dL   Creatinine, Ser 5.28 0.57 - 1.00 mg/dL   eGFR 97 >41 LK/GMW/1.02    BUN/Creatinine Ratio 20 12 - 28   Sodium 140 134 - 144 mmol/L   Potassium 4.0 3.5 - 5.2 mmol/L   Chloride 103 96 - 106 mmol/L   CO2 26 20 - 29 mmol/L   Calcium 9.7 8.7 - 10.3 mg/dL   Total Protein 7.1 6.0 - 8.5 g/dL   Albumin 4.4 3.9 - 4.9 g/dL   Globulin, Total 2.7 1.5 - 4.5 g/dL   Albumin/Globulin Ratio 1.6 1.2 - 2.2   Bilirubin Total 0.4 0.0 - 1.2 mg/dL   Alkaline Phosphatase 75 44 - 121 IU/L   AST 14 0 - 40 IU/L   ALT 12 0 - 32 IU/L  Lipid Panel w/o Chol/HDL Ratio  Result Value Ref Range   Cholesterol, Total 257 (H) 100 - 199 mg/dL   Triglycerides 59 0 - 149 mg/dL   HDL 64 >72 mg/dL   VLDL Cholesterol Cal 9 5 - 40 mg/dL   LDL Chol Calc (NIH) 536 (H) 0 - 99 mg/dL  Urinalysis, Routine w reflex microscopic  Result Value Ref Range   Specific Gravity, UA 1.020 1.005 - 1.030   pH, UA 6.5 5.0 - 7.5   Color, UA Yellow Yellow   Appearance Ur Clear Clear   Leukocytes,UA Negative Negative   Protein,UA Negative Negative/Trace   Glucose, UA Negative Negative   Ketones, UA Negative Negative   RBC, UA Trace (A) Negative   Bilirubin, UA Negative Negative   Urobilinogen, Ur 0.2 0.2 - 1.0 mg/dL   Nitrite, UA Negative Negative   Microscopic Examination See below:   TSH  Result Value Ref Range   TSH 3.060 0.450 - 4.500 uIU/mL      Assessment & Plan:   Problem List Items Addressed This Visit   None Visit Diagnoses     Routine general medical examination at a health care facility    -  Primary   Vaccines up to date. Screening labs checked today. Pap up to date. Mammo, DEXA and cologuard ordered. Continue diet and exercise. Call with any concerns.   Relevant Orders   CBC with Differential/Platelet   Comprehensive metabolic panel   Lipid Panel w/o Chol/HDL Ratio   TSH   Postmenopausal estrogen deficiency       DEXA ordered today.   Relevant Orders   DG Bone Density   Encounter for screening mammogram for malignant neoplasm of breast       Mammo ordered today.   Relevant Orders  MM 3D SCREENING MAMMOGRAM BILATERAL BREAST   Screening for colon cancer       Due in December- ordered today.   Relevant Orders   Cologuard   Need for vaccination for Strep pneumoniae       Will come back for it. Call with any concerns.   Relevant Orders   Pneumococcal conjugate vaccine 20-valent (Prevnar 20)        Follow up plan: Return in about 1 year (around 03/27/2024) for physical.   LABORATORY TESTING:  - Pap smear: up to date  IMMUNIZATIONS:   - Tdap: Tetanus vaccination status reviewed: last tetanus booster within 10 years. - Influenza: Administered today - Prevnar:  will come back and get it - COVID: Up to date - HPV: Not applicable - Shingrix vaccine: Refused  SCREENING: -Mammogram: Up to date  - Colonoscopy: Up to date  - Bone Density: Ordered today   PATIENT COUNSELING:   Advised to take 1 mg of folate supplement per day if capable of pregnancy.   Sexuality: Discussed sexually transmitted diseases, partner selection, use of condoms, avoidance of unintended pregnancy  and contraceptive alternatives.   Advised to avoid cigarette smoking.  I discussed with the patient that most people either abstain from alcohol or drink within safe limits (<=14/week and <=4 drinks/occasion for males, <=7/weeks and <= 3 drinks/occasion for females) and that the risk for alcohol disorders and other health effects rises proportionally with the number of drinks per week and how often a drinker exceeds daily limits.  Discussed cessation/primary prevention of drug use and availability of treatment for abuse.   Diet: Encouraged to adjust caloric intake to maintain  or achieve ideal body weight, to reduce intake of dietary saturated fat and total fat, to limit sodium intake by avoiding high sodium foods and not adding table salt, and to maintain adequate dietary potassium and calcium preferably from fresh fruits, vegetables, and low-fat dairy products.    stressed the importance of  regular exercise  Injury prevention: Discussed safety belts, safety helmets, smoke detector, smoking near bedding or upholstery.   Dental health: Discussed importance of regular tooth brushing, flossing, and dental visits.    NEXT PREVENTATIVE PHYSICAL DUE IN 1 YEAR. Return in about 1 year (around 03/27/2024) for physical.

## 2023-03-29 LAB — COMPREHENSIVE METABOLIC PANEL
ALT: 9 [IU]/L (ref 0–32)
AST: 16 [IU]/L (ref 0–40)
Albumin: 4.2 g/dL (ref 3.9–4.9)
Alkaline Phosphatase: 66 [IU]/L (ref 44–121)
BUN/Creatinine Ratio: 20 (ref 12–28)
BUN: 14 mg/dL (ref 8–27)
Bilirubin Total: 0.3 mg/dL (ref 0.0–1.2)
CO2: 23 mmol/L (ref 20–29)
Calcium: 9.6 mg/dL (ref 8.7–10.3)
Chloride: 103 mmol/L (ref 96–106)
Creatinine, Ser: 0.71 mg/dL (ref 0.57–1.00)
Globulin, Total: 2.9 g/dL (ref 1.5–4.5)
Glucose: 77 mg/dL (ref 70–99)
Potassium: 4.1 mmol/L (ref 3.5–5.2)
Sodium: 139 mmol/L (ref 134–144)
Total Protein: 7.1 g/dL (ref 6.0–8.5)
eGFR: 94 mL/min/{1.73_m2} (ref >59)

## 2023-03-29 LAB — CBC WITH DIFFERENTIAL/PLATELET
Basophils Absolute: 0.1 10*3/uL (ref 0.0–0.2)
Basos: 1 %
EOS (ABSOLUTE): 0.2 10*3/uL (ref 0.0–0.4)
Eos: 4 %
Hematocrit: 37.2 % (ref 34.0–46.6)
Hemoglobin: 11.5 g/dL (ref 11.1–15.9)
Immature Grans (Abs): 0 10*3/uL (ref 0.0–0.1)
Immature Granulocytes: 0 %
Lymphocytes Absolute: 2.1 10*3/uL (ref 0.7–3.1)
Lymphs: 42 %
MCH: 28.2 pg (ref 26.6–33.0)
MCHC: 30.9 g/dL — ABNORMAL LOW (ref 31.5–35.7)
MCV: 91 fL (ref 79–97)
Monocytes Absolute: 0.5 10*3/uL (ref 0.1–0.9)
Monocytes: 9 %
Neutrophils Absolute: 2.3 10*3/uL (ref 1.4–7.0)
Neutrophils: 44 %
Platelets: 367 10*3/uL (ref 150–450)
RBC: 4.08 x10E6/uL (ref 3.77–5.28)
RDW: 12.9 % (ref 11.7–15.4)
WBC: 5.1 10*3/uL (ref 3.4–10.8)

## 2023-03-29 LAB — TSH: TSH: 1.62 u[IU]/mL (ref 0.450–4.500)

## 2023-03-29 LAB — LIPID PANEL W/O CHOL/HDL RATIO
Cholesterol, Total: 239 mg/dL — ABNORMAL HIGH (ref 100–199)
HDL: 57 mg/dL (ref >39)
LDL Chol Calc (NIH): 164 mg/dL — ABNORMAL HIGH (ref 0–99)
Triglycerides: 100 mg/dL (ref 0–149)
VLDL Cholesterol Cal: 18 mg/dL (ref 5–40)

## 2023-04-17 ENCOUNTER — Other Ambulatory Visit: Payer: Self-pay | Admitting: Family Medicine

## 2023-04-17 DIAGNOSIS — R195 Other fecal abnormalities: Secondary | ICD-10-CM

## 2023-04-17 LAB — COLOGUARD: COLOGUARD: POSITIVE — AB

## 2023-04-17 NOTE — Progress Notes (Signed)
Please let her know that her cologuard came back positive. This does not mean that she has colon cancer but it does mean that she has to have a colonoscopy to make sure there's not anything else going on. They'll call her to get that scheduled. Let me know if she has any questions.

## 2023-04-19 ENCOUNTER — Other Ambulatory Visit: Payer: Self-pay

## 2023-04-19 ENCOUNTER — Telehealth: Payer: Self-pay

## 2023-04-19 DIAGNOSIS — Z1211 Encounter for screening for malignant neoplasm of colon: Secondary | ICD-10-CM

## 2023-04-19 DIAGNOSIS — R195 Other fecal abnormalities: Secondary | ICD-10-CM

## 2023-04-19 MED ORDER — NA SULFATE-K SULFATE-MG SULF 17.5-3.13-1.6 GM/177ML PO SOLN: 1.0000 | Freq: Once | ORAL | 0 refills | Status: AC

## 2023-04-19 NOTE — Telephone Encounter (Signed)
Gastroenterology Pre-Procedure Review  Request Date: 05/09/23 Requesting Physician: Dr. Allegra Lai  PATIENT REVIEW QUESTIONS: The patient responded to the following health history questions as indicated:    1. Are you having any GI issues?  Positive cologuard 2. Do you have a personal history of Polyps? no 3. Do you have a family history of Colon Cancer or Polyps? no 4. Diabetes Mellitus? no 5. Joint replacements in the past 12 months?no 6. Major health problems in the past 3 months?no 7. Any artificial heart valves, MVP, or defibrillator?no    MEDICATIONS & ALLERGIES:    Patient reports the following regarding taking any anticoagulation/antiplatelet therapy:   Plavix, Coumadin, Eliquis, Xarelto, Lovenox, Pradaxa, Brilinta, or Effient? no Aspirin? no  Patient confirms/reports the following medications:  Current Outpatient Medications  Medication Sig Dispense Refill   Biotin 10 MG CAPS Take by mouth.     Calcium Carbonate-Vitamin D (CALCIUM-VITAMIN D3 PO) Take 1 tablet by mouth daily.     Cyanocobalamin (VITAMIN B12 PO) Take 1 tablet by mouth daily.     ELDERBERRY PO Take by mouth.     Glucosamine-Chondroitin (GLUCOSAMINE CHONDR COMPLEX PO) Take 1 tablet by mouth daily.     Multiple Vitamin (MULTIVITAMIN) tablet Take 1 tablet by mouth daily.     Multiple Vitamins-Minerals (ZINC PO) Take by mouth. Pt unsure of dose     Nutritional Supplements (ESTROVEN PO) Take 1 tablet by mouth daily.     prednisoLONE acetate (PRED FORTE) 1 % ophthalmic suspension Place 1 drop into the right eye every morning.     No current facility-administered medications for this visit.    Patient confirms/reports the following allergies:  No Known Allergies  No orders of the defined types were placed in this encounter.   AUTHORIZATION INFORMATION Primary Insurance: 1D#: Group #:  Secondary Insurance: 1D#: Group #:  SCHEDULE INFORMATION: Date: 05/09/23 Time: Location: [facility]

## 2023-04-19 NOTE — Telephone Encounter (Signed)
Pt requesting call back to schedule colonoscopy.

## 2023-04-19 NOTE — Telephone Encounter (Signed)
okay

## 2023-04-21 ENCOUNTER — Telehealth: Payer: Self-pay

## 2023-04-21 NOTE — Telephone Encounter (Signed)
Pt requesting call back to reschedule procedure from 05/09/2023

## 2023-04-25 NOTE — Telephone Encounter (Signed)
okay

## 2023-04-27 ENCOUNTER — Telehealth: Payer: Self-pay

## 2023-04-27 NOTE — Telephone Encounter (Signed)
Dr. Allegra Lai is not going to be in the office on 05/09/2023 or be able to do scopes and we are going to need to reschedule her procedure. Called and left a message for call back to reschedule procedure.

## 2023-04-28 NOTE — Telephone Encounter (Signed)
Called and left a message for call back  

## 2023-05-01 NOTE — Telephone Encounter (Signed)
Sent mychart message

## 2023-05-01 NOTE — Telephone Encounter (Signed)
Called and left a message for call back  

## 2023-05-01 NOTE — Telephone Encounter (Signed)
Patient return call and she moved to 05/25/2023 with Dr. Allegra Lai she states she wants to stay with a female. Mailed out and sent to [employer] new instructions. Called endo and talk to vickie and she will get patient moved

## 2023-05-18 ENCOUNTER — Encounter: Payer: Self-pay | Admitting: Gastroenterology

## 2023-05-25 ENCOUNTER — Ambulatory Visit: Payer: Managed Care, Other (non HMO) | Admitting: Anesthesiology

## 2023-05-25 ENCOUNTER — Encounter
Admission: RE | Disposition: A | Discharge status: Home / Self Care | Payer: Self-pay | Source: Home / Self Care | Attending: Gastroenterology

## 2023-05-25 ENCOUNTER — Ambulatory Visit
Admission: RE | Admit: 2023-05-25 | Discharge: 2023-05-25 | Disposition: A | Discharge status: Home / Self Care | Payer: Managed Care, Other (non HMO) | Attending: Gastroenterology | Admitting: Gastroenterology

## 2023-05-25 ENCOUNTER — Encounter: Payer: Self-pay | Admitting: Gastroenterology

## 2023-05-25 DIAGNOSIS — D125 Benign neoplasm of sigmoid colon: Secondary | ICD-10-CM | POA: Insufficient documentation

## 2023-05-25 DIAGNOSIS — D120 Benign neoplasm of cecum: Secondary | ICD-10-CM | POA: Diagnosis not present

## 2023-05-25 DIAGNOSIS — Z1211 Encounter for screening for malignant neoplasm of colon: Secondary | ICD-10-CM | POA: Diagnosis not present

## 2023-05-25 DIAGNOSIS — K635 Polyp of colon: Secondary | ICD-10-CM

## 2023-05-25 DIAGNOSIS — R195 Other fecal abnormalities: Secondary | ICD-10-CM

## 2023-05-25 HISTORY — PX: COLONOSCOPY WITH PROPOFOL: SHX5780

## 2023-05-25 SURGERY — COLONOSCOPY WITH PROPOFOL
Anesthesia: General

## 2023-05-25 MED ORDER — STERILE WATER FOR IRRIGATION IR SOLN
Status: DC | PRN
  Administered 2023-05-25 (×2): 60 mL

## 2023-05-25 MED ORDER — LIDOCAINE HCL (PF) 2 % IJ SOLN
INTRAMUSCULAR | Status: AC
  Filled 2023-05-25: qty 5

## 2023-05-25 MED ORDER — DEXMEDETOMIDINE HCL IN NACL 80 MCG/20ML IV SOLN
INTRAVENOUS | Status: DC | PRN
  Administered 2023-05-25: 12 ug via INTRAVENOUS
  Administered 2023-05-25: 8 ug via INTRAVENOUS

## 2023-05-25 MED ORDER — PROPOFOL 10 MG/ML IV BOLUS
INTRAVENOUS | Status: DC | PRN
  Administered 2023-05-25: 30 mg via INTRAVENOUS
  Administered 2023-05-25: 50 mg via INTRAVENOUS

## 2023-05-25 MED ORDER — PROPOFOL 500 MG/50ML IV EMUL
INTRAVENOUS | Status: DC | PRN
  Administered 2023-05-25: 75 ug/kg/min via INTRAVENOUS

## 2023-05-25 MED ORDER — SODIUM CHLORIDE 0.9 % IV SOLN: INTRAVENOUS | Status: DC

## 2023-05-25 MED ORDER — LIDOCAINE HCL (CARDIAC) PF 100 MG/5ML IV SOSY
PREFILLED_SYRINGE | INTRAVENOUS | Status: DC | PRN
  Administered 2023-05-25: 60 mg via INTRAVENOUS

## 2023-05-25 NOTE — Op Note (Signed)
[facility] Patient Name: Yolanda Rodgers Procedure Date: 05/25/2023 9:41 AM MRN: [id number] Account #: [id number] Date of Birth: Jul 22, 1955 Admit Type: Outpatient Age: 67 Room: [facility] ENDO ROOM 3 Gender: Female Note Status: Finalized Instrument Name: Colonoscope [id number] Procedure:             Colonoscopy Indications:           This is the patient's first colonoscopy, Positive                         Cologuard test Providers:             Toney Reil MD, MD Referring MD:          Dorcas Carrow (Referring MD) Medicines:             General Anesthesia Complications:         No immediate complications. Estimated blood loss: None. Procedure:             Pre-Anesthesia Assessment:                        - Prior to the procedure, a History and Physical was                         performed, and patient medications and allergies were                         reviewed. The patient is competent. The risks and                         benefits of the procedure and the sedation options and                         risks were discussed with the patient. All questions                         were answered and informed consent was obtained.                         Patient identification and proposed procedure were                         verified by the physician, the nurse, the                         anesthesiologist, the anesthetist and the technician                         in the pre-procedure area in the procedure room in the                         endoscopy suite. Mental Status Examination: alert and                         oriented. Airway Examination: normal oropharyngeal                         airway and neck mobility. Respiratory Examination:  clear to auscultation. CV Examination: normal.                         Prophylactic Antibiotics: The patient does not require                         prophylactic antibiotics. Prior  Anticoagulants: The                         patient has taken no anticoagulant or antiplatelet                         agents. ASA Grade Assessment: I - A normal, healthy                         patient. After reviewing the risks and benefits, the                         patient was deemed in satisfactory condition to                         undergo the procedure. The anesthesia plan was to use                         general anesthesia. Immediately prior to                         administration of medications, the patient was                         re-assessed for adequacy to receive sedatives. The                         heart rate, respiratory rate, oxygen saturations,                         blood pressure, adequacy of pulmonary ventilation, and                         response to care were monitored throughout the                         procedure. The physical status of the patient was                         re-assessed after the procedure.                        After obtaining informed consent, the colonoscope was                         passed under direct vision. Throughout the procedure,                         the patient's blood pressure, pulse, and oxygen                         saturations were monitored continuously. The  Colonoscope was introduced through the anus and                         advanced to the the cecum, identified by appendiceal                         orifice and ileocecal valve. The colonoscopy was                         performed without difficulty. The patient tolerated                         the procedure well. The quality of the bowel                         preparation was evaluated using the BBPS [facility] Bowel                         Preparation Scale) with scores of: Right Colon = 3,                         Transverse Colon = 3 and Left Colon = 3 (entire mucosa                         seen well with no residual staining,  small fragments                         of stool or opaque liquid). The total BBPS score                         equals 9. The ileocecal valve, appendiceal orifice,                         and rectum were photographed. Findings:      The perianal and digital rectal examinations were normal. Pertinent       negatives include normal sphincter tone and no palpable rectal lesions.      Two sessile polyps were found in the cecum. The polyps were 3 to 4 mm in       size. These polyps were removed with a cold snare. Resection and       retrieval were complete.      A 10 mm polyp was found in the sigmoid colon. The polyp was       semi-pedunculated. The polyp was removed with a hot snare. Resection and       retrieval were complete. Impression:            - Two 3 to 4 mm polyps in the cecum, removed with a                         cold snare. Resected and retrieved.                        - One 10 mm polyp in the sigmoid colon, removed with a                         hot snare. Resected and retrieved. Recommendation:        -  Discharge patient to home (with escort).                        - Resume previous diet today.                        - Continue present medications.                        - Await pathology results.                        - Repeat colonoscopy in 3 years for surveillance of                         multiple polyps. Procedure Code(s):     --- Professional ---                        [phone], Colonoscopy, flexible; with removal of                         tumor(s), polyp(s), or other lesion(s) by snare                         technique Diagnosis Code(s):     --- Professional ---                        D12.0, Benign neoplasm of cecum                        D12.5, Benign neoplasm of sigmoid colon                        R19.5, Other fecal abnormalities CPT copyright 2022 American Medical Association. All rights reserved. The codes documented in this report are preliminary and upon  coder review may  be revised to meet current compliance requirements. Dr. Libby Maw Toney Reil MD, MD 05/25/2023 10:18:39 AM This report has been signed electronically. Number of Addenda: 0 Note Initiated On: 05/25/2023 9:41 AM Scope Withdrawal Time: 0 hours 15 minutes 22 seconds  Total Procedure Duration: 0 hours 18 minutes 48 seconds  Estimated Blood Loss:  Estimated blood loss: none.      [facility]

## 2023-05-25 NOTE — H&P (Signed)
Arlyss Repress, MD [address]  [address]  [place], [place] [address]  Main: [phone]  Fax: [phone] Pager: [phone]  Primary Care Physician:  Dorcas Carrow, DO Primary Gastroenterologist:  Dr. Arlyss Repress  Pre-Procedure History & Physical: HPI:  Yolanda Rodgers is a 67 y.o. female is here for an colonoscopy.   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 Authorizing Provider  Biotin 10 MG CAPS Take by mouth.    [provider]  Calcium Carbonate-Vitamin D (CALCIUM-VITAMIN D3 PO) Take 1 tablet by mouth daily.    [provider]  Cyanocobalamin (VITAMIN B12 PO) Take 1 tablet by mouth daily.    [provider]  ELDERBERRY PO Take by mouth.    [provider]  Glucosamine-Chondroitin (GLUCOSAMINE CHONDR COMPLEX PO) Take 1 tablet by mouth daily.    [provider]  Multiple Vitamin (MULTIVITAMIN) tablet Take 1 tablet by mouth daily.    [provider]  Multiple Vitamins-Minerals (ZINC PO) Take by mouth. Pt unsure of dose    [provider]  Nutritional Supplements (ESTROVEN PO) Take 1 tablet by mouth daily.    [provider]  prednisoLONE acetate (PRED FORTE) 1 % ophthalmic suspension Place 1 drop into the right eye every morning. 01/13/21   [provider]    Allergies as of 04/19/2023   (No Known Allergies)    Family History  Adopted: Yes    Social History   Socioeconomic History   Marital status: Married    Spouse name: Not on file   Number of  children: Not on file   Years of education: Not on file   Highest education level: Not on file  Occupational History   Not on file  Tobacco Use   Smoking status: Never   Smokeless tobacco: Never  Vaping Use   Vaping status: Never Used  Substance and Sexual Activity   Alcohol use: Yes    Comment: on occasion   Drug use: No   Sexual activity: Yes    Birth control/protection: None  Other Topics Concern   Not on file  Social History Narrative   Not on file   Social Drivers of Health   Financial Resource Strain: Low Risk  (03/28/2023)   Overall Financial Resource Strain (CARDIA)    Difficulty of Paying Living Expenses: Not very hard  Food Insecurity: No Food Insecurity (03/28/2023)   Hunger Vital Sign    Worried About Running Out of Food in the Last Year: Never true    Ran Out of Food in the Last Year: Never true  Transportation Needs: Patient Declined (03/28/2023)   PRAPARE - Transportation    Lack of Transportation (Medical): Patient declined    Lack of Transportation (Non-Medical): Patient declined  Physical Activity: Insufficiently Active (03/28/2023)   Exercise Vital Sign    Days of Exercise per Week: 2 days    Minutes of Exercise per Session: 60 min  Stress: Stress Concern Present (03/28/2023)  [employer] of Occupational Health - Occupational Stress Questionnaire    Feeling of Stress : To some extent  Social Connections: Socially Integrated (03/28/2023)   Social Connection and Isolation Panel [NHANES]    Frequency of Communication with Friends and Family: Twice a week    Frequency of Social Gatherings with Friends and Family: Twice a week    Attends Religious Services: More than 4 times per year    Active Member of [employer] or Organizations: Yes    Attends Engineer, structural: More than 4 times per year    Marital Status: Married  Catering manager Violence: Not on file    Review of Systems: See HPI, otherwise negative ROS  Physical Exam: BP  123/72   Pulse 73   Temp (!) 97.4 F (36.3 C) (Temporal)   Resp 18   Ht 5\' 6"  (1.676 m)   Wt 83 kg   LMP  (LMP Unknown)   SpO2 100%   BMI 29.54 kg/m  General:   Alert,  pleasant and cooperative in NAD Head:  Normocephalic and atraumatic. Neck:  Supple; no masses or thyromegaly. Lungs:  Clear throughout to auscultation.    Heart:  Regular rate and rhythm. Abdomen:  Soft, nontender and nondistended. Normal bowel sounds, without guarding, and without rebound.   Neurologic:  Alert and  oriented x4;  grossly normal neurologically.  Impression/Plan: Yolanda Rodgers is here for an colonoscopy to be performed for cologuard positive  Risks, benefits, limitations, and alternatives regarding  colonoscopy have been reviewed with the patient.  Questions have been answered.  All parties agreeable.   Lannette Donath, MD  05/25/2023, 8:44 AM

## 2023-05-25 NOTE — Transfer of Care (Signed)
Immediate Anesthesia Transfer of Care Note  Patient: Yolanda Rodgers  Procedure(s) Performed: COLONOSCOPY WITH PROPOFOL  Patient Location: PACU  Anesthesia Type:General  Level of Consciousness: sedated  Airway & Oxygen Therapy: Patient Spontanous Breathing and Patient connected to nasal cannula oxygen  Post-op Assessment: Report given to RN and Post -op Vital signs reviewed and stable  Post vital signs: Reviewed and stable  Last Vitals:  Vitals Value Taken Time  BP    Temp    Pulse 59 05/25/23 1020  Resp 13 05/25/23 1020  SpO2 100 % 05/25/23 1020  Vitals shown include unfiled device data.  Last Pain:  Vitals:   05/25/23 0832  TempSrc: Temporal  PainSc: 0-No pain         Complications: No notable events documented.

## 2023-05-25 NOTE — Anesthesia Postprocedure Evaluation (Signed)
Anesthesia Post Note  Patient: Kaarin Huet  Procedure(s) Performed: COLONOSCOPY WITH PROPOFOL  Patient location during evaluation: Endoscopy Anesthesia Type: General Level of consciousness: awake and alert Pain management: pain level controlled Vital Signs Assessment: post-procedure vital signs reviewed and stable Respiratory status: spontaneous breathing, nonlabored ventilation, respiratory function stable and patient connected to nasal cannula oxygen Cardiovascular status: blood pressure returned to baseline and stable Postop Assessment: no apparent nausea or vomiting Anesthetic complications: no   No notable events documented.   Last Vitals:  Vitals:   05/25/23 1020 05/25/23 1043  BP: (!) 148/99 105/69  Pulse:    Resp:    Temp: 36.4 C   SpO2:      Last Pain:  Vitals:   05/25/23 1043  TempSrc:   PainSc: 0-No pain                 Cleda Mccreedy Loretta Kluender

## 2023-05-25 NOTE — Anesthesia Preprocedure Evaluation (Signed)
Anesthesia Evaluation  Patient identified by MRN, date of birth, ID band Patient awake    Reviewed: Allergy & Precautions, NPO status , Patient's Chart, lab work & pertinent test results  History of Anesthesia Complications Negative for: history of anesthetic complications  Airway Mallampati: III  TM Distance: >3 FB Neck ROM: full    Dental  (+) Chipped   Pulmonary neg pulmonary ROS, neg shortness of breath   Pulmonary exam normal        Cardiovascular Exercise Tolerance: Good (-) angina negative cardio ROS Normal cardiovascular exam     Neuro/Psych negative neurological ROS  negative psych ROS   GI/Hepatic negative GI ROS, Neg liver ROS,neg GERD  ,,  Endo/Other  negative endocrine ROS    Renal/GU negative Renal ROS  negative genitourinary   Musculoskeletal   Abdominal   Peds  Hematology negative hematology ROS (+)   Anesthesia Other Findings Past Medical History: No date: Cataract 08/2020: History of right hip replacement 03/2021: Ingrown toenail  Past Surgical History: 1999: BREAST BIOPSY; Right     Comment:  negative per patient No date: DILATION AND CURETTAGE OF UTERUS on file: EYE SURGERY No date: JOINT REPLACEMENT 05/06/2021: TOTAL HIP ARTHROPLASTY; Right 05/2021: TOTAL HIP ARTHROPLASTY; Left  BMI    Body Mass Index: 29.54 kg/m      Reproductive/Obstetrics negative OB ROS                             Anesthesia Physical Anesthesia Plan  ASA: 1  Anesthesia Plan: General   Post-op Pain Management:    Induction: Intravenous  PONV Risk Score and Plan: Propofol infusion and TIVA  Airway Management Planned: Natural Airway and Nasal Cannula  Additional Equipment:   Intra-op Plan:   Post-operative Plan:   Informed Consent: I have reviewed the patients History and Physical, chart, labs and discussed the procedure including the risks, benefits and alternatives  for the proposed anesthesia with the patient or authorized representative who has indicated his/her understanding and acceptance.     Dental Advisory Given  Plan Discussed with: Anesthesiologist, CRNA and Surgeon  Anesthesia Plan Comments: (Patient consented for risks of anesthesia including but not limited to:  - adverse reactions to medications - risk of airway placement if required - damage to eyes, teeth, lips or other oral mucosa - nerve damage due to positioning  - sore throat or hoarseness - Damage to heart, brain, nerves, lungs, other parts of body or loss of life  Patient voiced understanding and assent.)       Anesthesia Quick Evaluation

## 2023-05-26 ENCOUNTER — Encounter: Payer: Self-pay | Admitting: Gastroenterology

## 2023-05-26 LAB — SURGICAL PATHOLOGY

## 2023-05-30 ENCOUNTER — Encounter: Payer: Self-pay | Admitting: Gastroenterology

## 2023-06-28 IMAGING — MG MM DIGITAL SCREENING BILAT W/ TOMO AND CAD
8 series · 8 of 24 positions shown · non-contrast
Comparison: Previous exams 06/03/2019 and earlier from [HOSPITAL]
[REDACTED].

CLINICAL DATA: Screening.

EXAM:
DIGITAL SCREENING BILATERAL MAMMOGRAM WITH TOMOSYNTHESIS AND CAD
TECHNIQUE: Bilateral screening digital craniocaudal and mediolateral oblique
mammograms were obtained. Bilateral screening digital breast
tomosynthesis was performed. The images were evaluated with
computer-aided detection.

[L MLO synth-2D]
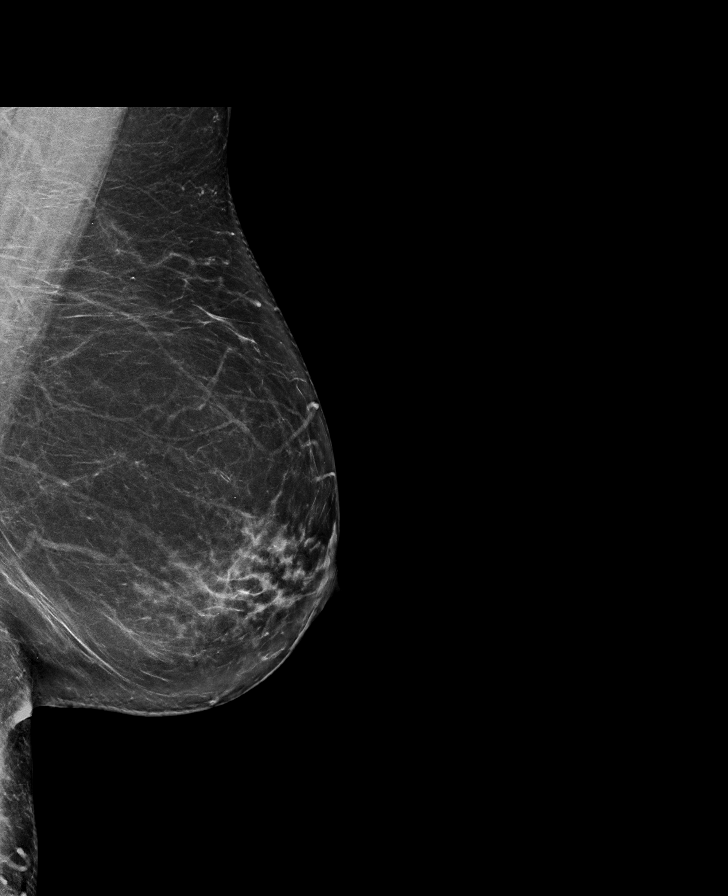

[R CC synth-2D]
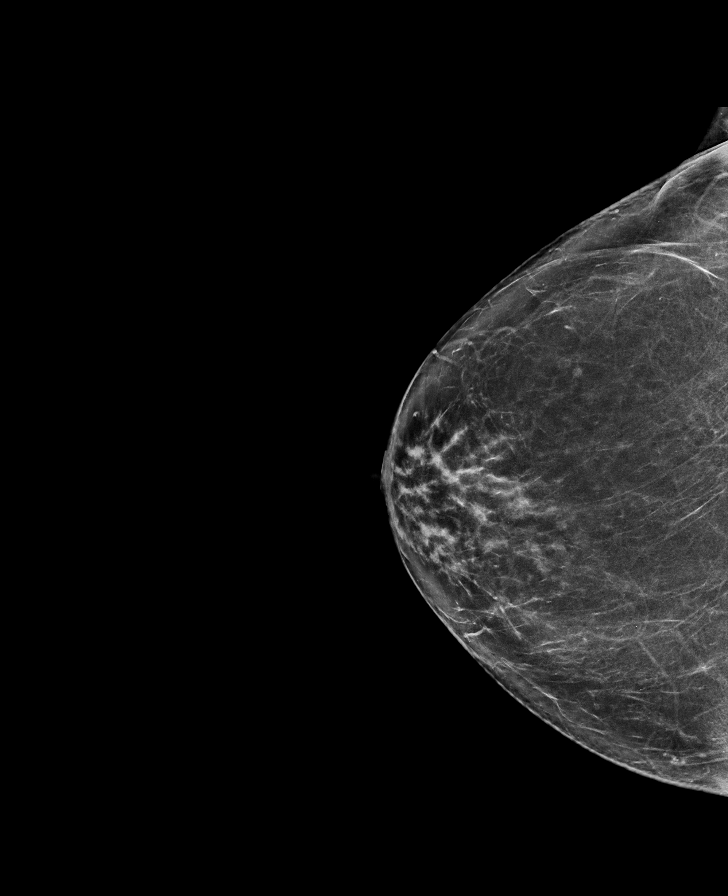

[R MLO synth-2D]
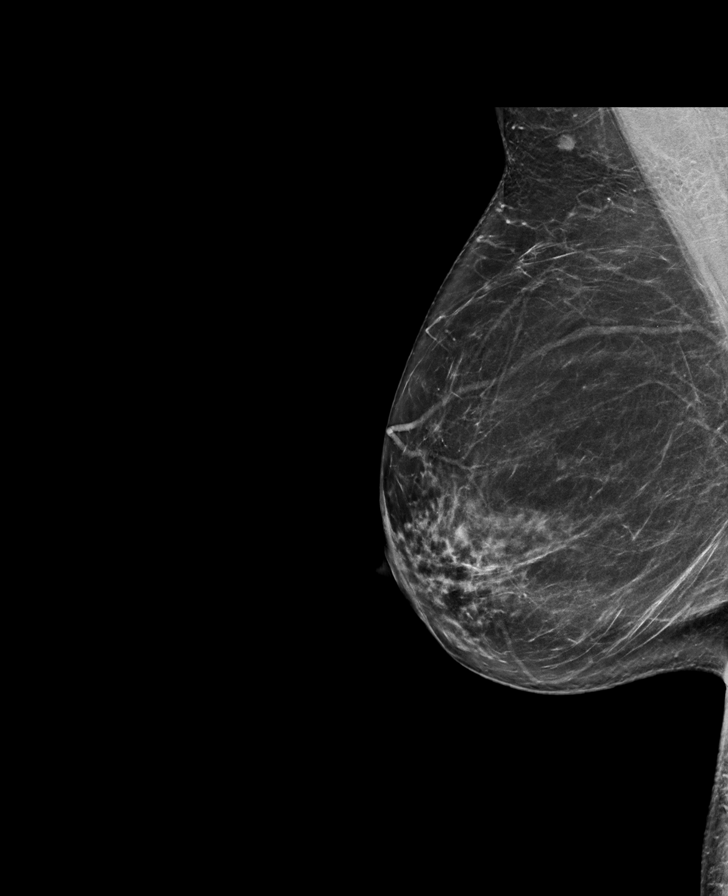

[L CC synth-2D]
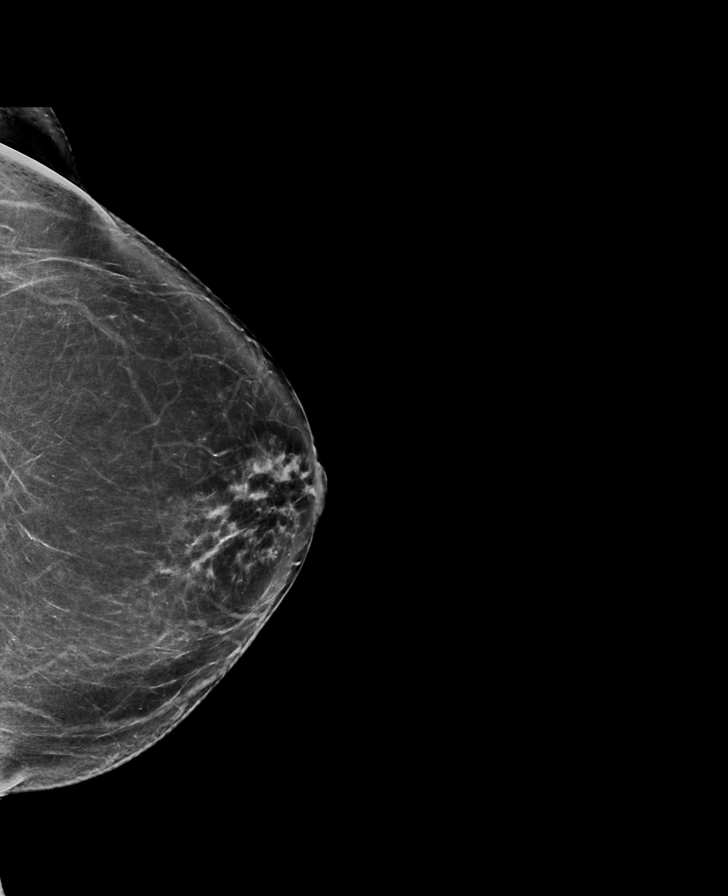

[L CC tomo · tomo slice 42/83.0]
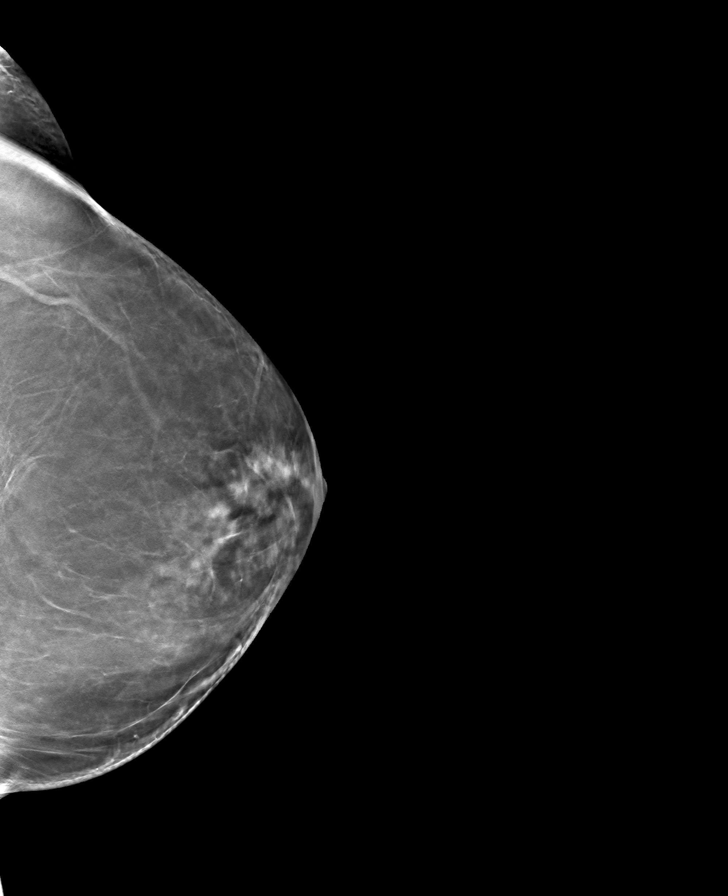

[L MLO tomo · tomo slice 40/79.0]
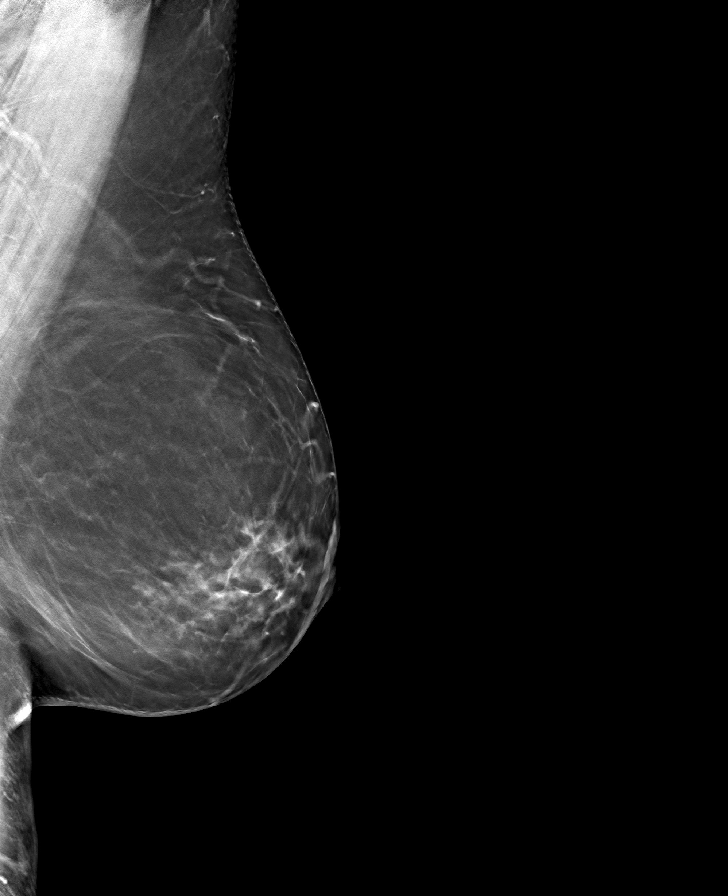

[R CC tomo · tomo slice 39/76.0]
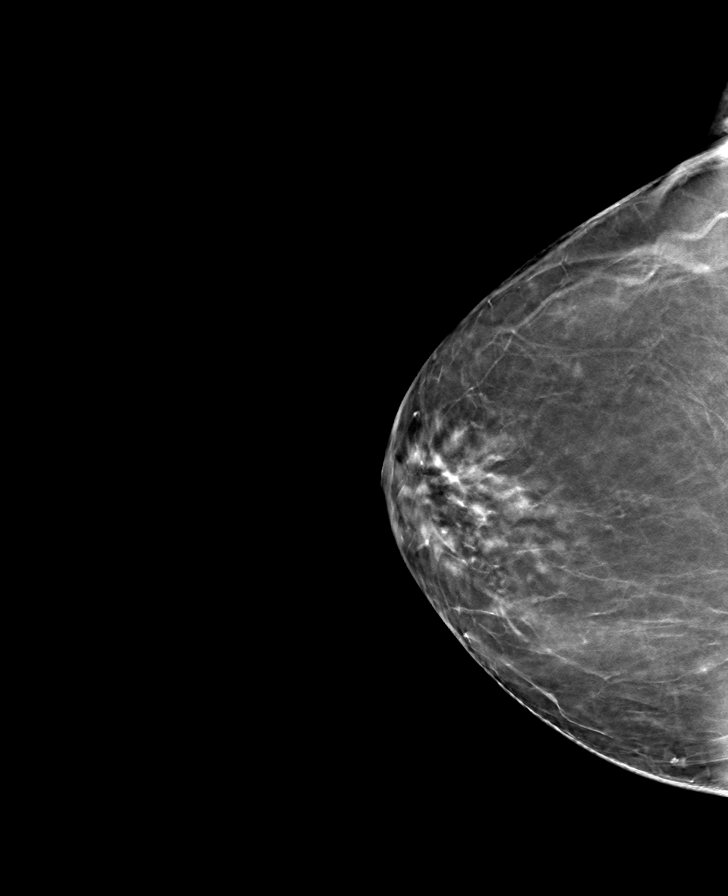

[R MLO tomo · tomo slice 39/78.0]
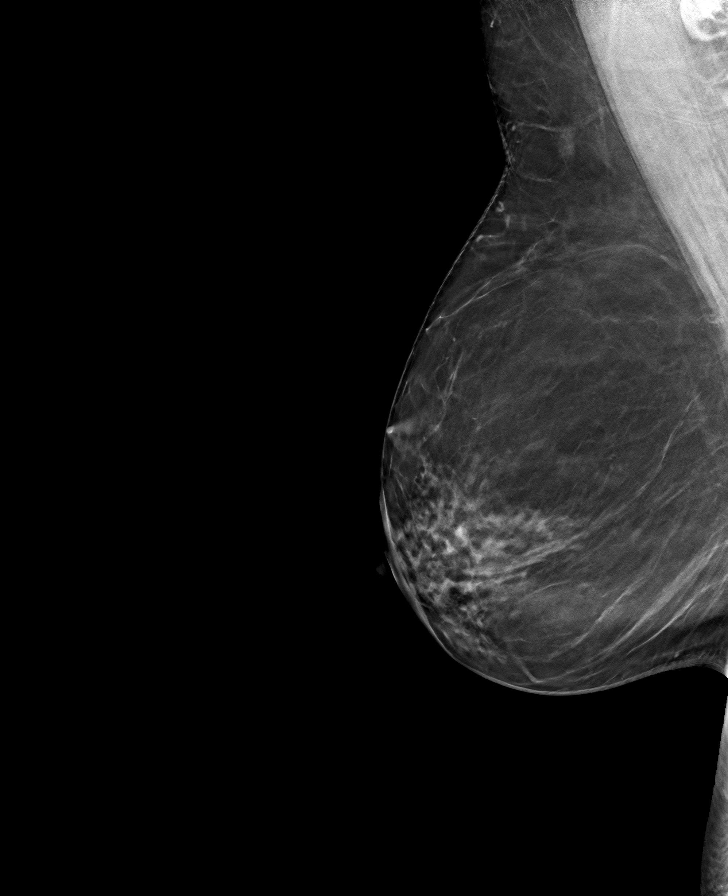

[8 of 24 positions shown; findings below may reference images not displayed]

Awaiting the prior outside mammograms
accounts for the delay in this report.

ACR Breast Density Category b: There are scattered areas of
fibroglandular density.
FINDINGS: There are no findings suspicious for malignancy.
IMPRESSION: No mammographic evidence of malignancy. A result letter of this
screening mammogram will be mailed directly to the patient.

RECOMMENDATION:
Screening mammogram in one year. (Code:P6-4-KA1)

BI-RADS CATEGORY  1: Negative.

## 2023-08-07 ENCOUNTER — Ambulatory Visit: Payer: Managed Care, Other (non HMO) | Admitting: Family Medicine

## 2023-08-08 ENCOUNTER — Ambulatory Visit: Admitting: Nurse Practitioner

## 2023-08-08 ENCOUNTER — Encounter: Payer: Self-pay | Admitting: Nurse Practitioner

## 2023-08-08 VITALS — BP 112/73 | HR 70 | Temp 98.0°F | Ht 66.0 in | Wt 187.0 lb

## 2023-08-08 DIAGNOSIS — M79671 Pain in right foot: Secondary | ICD-10-CM | POA: Diagnosis not present

## 2023-08-08 NOTE — Progress Notes (Signed)
 BP 112/73 (BP Location: Left Arm, Patient Position: Sitting, Cuff Size: Small)   Pulse 70   Temp 98 F (36.7 C) (Oral)   Ht 5\' 6"  (1.676 m)   Wt 187 lb (84.8 kg)   LMP  (LMP Unknown)   SpO2 96%   BMI 30.18 kg/m    Subjective:    Patient ID: Yolanda Rodgers, female    DOB: 02/23/1956, 68 y.o.   MRN: [id number]  HPI: Yolanda Rodgers is a 68 y.o. female  Chief Complaint  Patient presents with   Pain    Patient states its the right foot. It started two weeks ago when she started walking her new puppy. When she sit it will throb a little. Patient feels like its swelling. Patient did Hot and cold compress. Patient did an ointment topping. Everything she tried was over the counter. Pt took an Advil at night (2).    FOOT PAIN Duration: weeks Involved foot: right Mechanism of injury:  twisted her ankle a little when walking her dog Location:  Onset: sudden  Severity: 3/10  Quality:  aching Frequency: intermittent Radiation: no Aggravating factors: walking  Alleviating factors:  asper cream heat, ice, and NSAIDs  Status: fluctuating Treatments attempted:  soaking in epson salt and heat  Relief with NSAIDs?:  moderate Weakness with weight bearing or walking: no Morning stiffness: yes Swelling: yes Redness: no Bruising: no Paresthesias / decreased sensation: no  Fevers:no  Relevant past medical, surgical, family and social history reviewed and updated as indicated. Interim medical history since our last visit reviewed. Allergies and medications reviewed and updated.  Review of Systems  Musculoskeletal:        Right foot pain and swelling    Per HPI unless specifically indicated above     Objective:    BP 112/73 (BP Location: Left Arm, Patient Position: Sitting, Cuff Size: Small)   Pulse 70   Temp 98 F (36.7 C) (Oral)   Ht 5\' 6"  (1.676 m)   Wt 187 lb (84.8 kg)   LMP  (LMP Unknown)   SpO2 96%   BMI 30.18 kg/m   Wt Readings from Last 3 Encounters:  08/08/23 187  lb (84.8 kg)  05/25/23 183 lb (83 kg)  03/28/23 188 lb 3.2 oz (85.4 kg)    Physical Exam Vitals and nursing note reviewed.  Constitutional:      General: She is not in acute distress.    Appearance: Normal appearance. She is normal weight. She is not ill-appearing, toxic-appearing or diaphoretic.  HENT:     Head: Normocephalic.     Right Ear: External ear normal.     Left Ear: External ear normal.     Nose: Nose normal.     Mouth/Throat:     Mouth: Mucous membranes are moist.     Pharynx: Oropharynx is clear.  Eyes:     General:        Right eye: No discharge.        Left eye: No discharge.     Extraocular Movements: Extraocular movements intact.     Conjunctiva/sclera: Conjunctivae normal.     Pupils: Pupils are equal, round, and reactive to light.  Cardiovascular:     Rate and Rhythm: Normal rate and regular rhythm.     Heart sounds: No murmur heard. Pulmonary:     Effort: Pulmonary effort is normal. No respiratory distress.     Breath sounds: Normal breath sounds. No wheezing or rales.  Musculoskeletal:  Cervical back: Normal range of motion and neck supple.     Right foot: Normal range of motion and normal capillary refill. Swelling and tenderness present. No deformity, bunion, Charcot foot, foot drop, prominent metatarsal heads, laceration, bony tenderness or crepitus. Normal pulse.       Legs:  Skin:    General: Skin is warm and dry.     Capillary Refill: Capillary refill takes less than 2 seconds.  Neurological:     General: No focal deficit present.     Mental Status: She is alert and oriented to person, place, and time. Mental status is at baseline.  Psychiatric:        Mood and Affect: Mood normal.        Behavior: Behavior normal.        Thought Content: Thought content normal.        Judgment: Judgment normal.     Results for orders placed or performed during the hospital encounter of 05/25/23  Surgical pathology   Collection Time: 05/25/23 12:00 AM   Result Value Ref Range   SURGICAL PATHOLOGY      SURGICAL PATHOLOGY [facility] [address] [place], [place] [address] Telephone [phone] or [phone] Fax [phone]  REPORT OF SURGICAL PATHOLOGY   Accession #: [id number] Patient Name: Yolanda Rodgers Visit # : [id number]  MRN: [id number] Physician: Lannette Donath DOB/Age [birth year] (Age: 55) Gender: F Collected Date: 05/25/2023 Received Date: 05/25/2023  FINAL DIAGNOSIS       1. 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       2. 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      *MARGIN NEGATIVE FOR ADENOMA.       ELECTRONIC SIGNATURE : Jacelyn Grip, John, Pathologist, Electronic Signature  MICROSCOPIC DESCRIPTION  CASE COMMENTS STAINS USED IN DIAGNOSIS: H&E H&E H&E    CLINICAL HISTORY  SPECIMEN(S) OBTAINED 1. Cecum Polyp, X2 Cold Snare 2. Sigmoid  C olon Polyp, Hot Snare  SPECIMEN COMMENTS: SPECIMEN CLINICAL INFORMATION: 1. Screening colonscopy, postive cologuard.Colon polyps    Gross Description 1. "Cecum colon polyp cold snare x2", received in formalin is a 2.0 x 1.1 x 0.1 cm aggregate of multiple tan-gray tissue fragments.The specimen is filtered and submitted in toto in 1 block (1A). 2. "Sigmoid colon polyp hot snare", received in formalin is a 1.0 x 0.7 x 0.3 cm aggregate of 4 tan-pink tissue fragments.The specimen is filtered and submitted in toto in 2 blocks (2A-B) with 1 bisected fragment (margin inked blue) in 2B.      AMG 05/25/2023        Report signed out from the following location(s) [facility] [address], [place] [address] CLIA #: [id number]  [facility] [address] [place], [place] [address] CLIA #: [id number]       Assessment &  Plan:   Problem List Items Addressed This Visit   None Visit Diagnoses       Right foot pain    -  Primary   Continue with Epsom salt soaks, OTC aleve and Voltaren gel.  If not improved in two weeks can order xray.        Follow up plan: Return if symptoms worsen or  fail to improve.

## 2023-08-24 ENCOUNTER — Ambulatory Visit: Admitting: Nurse Practitioner

## 2023-08-25 ENCOUNTER — Ambulatory Visit (INDEPENDENT_AMBULATORY_CARE_PROVIDER_SITE_OTHER): Admitting: Family Medicine

## 2023-08-25 ENCOUNTER — Encounter: Payer: Self-pay | Admitting: Family Medicine

## 2023-08-25 VITALS — BP 98/63 | HR 84 | Temp 98.6°F | Resp 16 | Ht 65.98 in | Wt 186.4 lb

## 2023-08-25 DIAGNOSIS — M79671 Pain in right foot: Secondary | ICD-10-CM | POA: Diagnosis not present

## 2023-08-25 MED ORDER — PREDNISONE 50 MG PO TABS
50.0000 mg | ORAL_TABLET | Freq: Every day | ORAL | 0 refills | Status: DC
Start: 2023-08-25 — End: 2023-10-24

## 2023-08-25 NOTE — Progress Notes (Signed)
 BP 98/63 (BP Location: Left Arm, Patient Position: Sitting, Cuff Size: Normal)   Pulse 84   Temp 98.6 F (37 C) (Oral)   Resp 16   Ht 5' 5.98" (1.676 m)   Wt 186 lb 6.4 oz (84.6 kg)   LMP  (LMP Unknown)   SpO2 100%   BMI 30.10 kg/m    Subjective:    Patient ID: Durenda Hurt, female    DOB: 05-28-1956, 68 y.o.   MRN: [id number]  HPI: Brandan Glauber is a 68 y.o. female  Chief Complaint  Patient presents with   Foot Injury    Concerned with ongoing achy, limping and swelling. Hurt about a month ago.    FOOT PAIN Duration: about a month Involved foot: right Mechanism of injury: had it stepped on and pulled by new puppy Location: top of her foot and inner ball of her R foot Onset: sudden  Severity: mild-moderate  Quality:  aching and dull Frequency: intermittent Radiation: no Aggravating factors: walking on it and standing on it Alleviating factors: elevation, epsom salt, voltaren  Status: better Treatments attempted:  voltaren, soaking, rest, ice, and heat  Relief with NSAIDs?:  moderate Weakness with weight bearing or walking: no Morning stiffness: no Swelling: yes Redness: no Bruising: no Paresthesias / decreased sensation: no  Fevers:no   Relevant past medical, surgical, family and social history reviewed and updated as indicated. Interim medical history since our last visit reviewed. Allergies and medications reviewed and updated.  Review of Systems  Constitutional: Negative.   Respiratory: Negative.    Cardiovascular: Negative.   Gastrointestinal: Negative.   Musculoskeletal: Negative.   Skin: Negative.   Neurological: Negative.   Psychiatric/Behavioral: Negative.      Per HPI unless specifically indicated above     Objective:    BP 98/63 (BP Location: Left Arm, Patient Position: Sitting, Cuff Size: Normal)   Pulse 84   Temp 98.6 F (37 C) (Oral)   Resp 16   Ht 5' 5.98" (1.676 m)   Wt 186 lb 6.4 oz (84.6 kg)   LMP  (LMP Unknown)   SpO2  100%   BMI 30.10 kg/m   Wt Readings from Last 3 Encounters:  08/25/23 186 lb 6.4 oz (84.6 kg)  08/08/23 187 lb (84.8 kg)  05/25/23 183 lb (83 kg)    Physical Exam Vitals and nursing note reviewed.  Constitutional:      General: She is not in acute distress.    Appearance: Normal appearance. She is not ill-appearing, toxic-appearing or diaphoretic.  HENT:     Head: Normocephalic and atraumatic.     Right Ear: External ear normal.     Left Ear: External ear normal.     Nose: Nose normal.     Mouth/Throat:     Mouth: Mucous membranes are moist.     Pharynx: Oropharynx is clear.  Eyes:     General: No scleral icterus.       Right eye: No discharge.        Left eye: No discharge.     Extraocular Movements: Extraocular movements intact.     Conjunctiva/sclera: Conjunctivae normal.     Pupils: Pupils are equal, round, and reactive to light.  Cardiovascular:     Rate and Rhythm: Normal rate and regular rhythm.     Pulses: Normal pulses.     Heart sounds: Normal heart sounds. No murmur heard.    No friction rub. No gallop.  Pulmonary:     Effort: Pulmonary  effort is normal. No respiratory distress.     Breath sounds: Normal breath sounds. No stridor. No wheezing, rhonchi or rales.  Chest:     Chest wall: No tenderness.  Musculoskeletal:        General: Tenderness (R 3rd metarsal) present. Normal range of motion.     Cervical back: Normal range of motion and neck supple.  Skin:    General: Skin is warm and dry.     Capillary Refill: Capillary refill takes less than 2 seconds.     Coloration: Skin is not jaundiced or pale.     Findings: No bruising, erythema, lesion or rash.  Neurological:     General: No focal deficit present.     Mental Status: She is alert and oriented to person, place, and time. Mental status is at baseline.  Psychiatric:        Mood and Affect: Mood normal.        Behavior: Behavior normal.        Thought Content: Thought content normal.         Judgment: Judgment normal.     Results for orders placed or performed during the hospital encounter of 05/25/23  Surgical pathology   Collection Time: 05/25/23 12:00 AM  Result Value Ref Range   SURGICAL PATHOLOGY      SURGICAL PATHOLOGY [facility] [address] [place], [place] [address] Telephone [phone] or [phone] Fax [phone]  REPORT OF SURGICAL PATHOLOGY   Accession #: [id number] Patient Name: CASIDEE, JANN Visit # : [id number]  MRN: [id number] Physician: Lannette Donath DOB/Age 19-Oct-1955 (Age: 68) Gender: F Collected Date: 05/25/2023 Received Date: 05/25/2023  FINAL DIAGNOSIS       1. 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       2. 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      *MARGIN NEGATIVE FOR ADENOMA.       ELECTRONIC SIGNATURE : Jacelyn Grip, John, Pathologist, Electronic Signature  MICROSCOPIC DESCRIPTION  CASE COMMENTS STAINS USED IN DIAGNOSIS: H&E H&E H&E    CLINICAL HISTORY  SPECIMEN(S) OBTAINED 1. Cecum Polyp, X2 Cold Snare 2. Sigmoid  C olon Polyp, Hot Snare  SPECIMEN COMMENTS: SPECIMEN CLINICAL INFORMATION: 1. Screening colonscopy, postive cologuard.Colon polyps    Gross Description 1. "Cecum colon polyp cold snare x2", received in formalin is a 2.0 x 1.1 x 0.1 cm aggregate of multiple tan-gray tissue fragments.The specimen is filtered and submitted in toto in 1 block (1A). 2. "Sigmoid colon polyp hot snare", received in formalin is a 1.0 x 0.7 x 0.3 cm aggregate of 4 tan-pink tissue fragments.The specimen is filtered and submitted in toto in 2 blocks (2A-B) with 1 bisected fragment (margin inked blue) in 2B.      AMG 05/25/2023        Report signed out from the following location(s) [place] [address], [place] [address] CLIA #: [id number]  [facility] [address] [place], [place] [address] CLIA #: [id number]       Assessment & Plan:   Problem List Items Addressed This Visit   None Visit Diagnoses       Right foot pain    -  Primary  No better. Will treat with prednisone and stretches. Will obtain x-ray. Await results. Treat as needed.   Relevant Orders   DG Foot Complete Right        Follow up plan: Return As able for welcome to medicare ( ).

## 2023-08-28 ENCOUNTER — Ambulatory Visit
Admission: RE | Admit: 2023-08-28 | Discharge: 2023-08-28 | Disposition: A | Discharge status: Home / Self Care | Source: Ambulatory Visit | Attending: Family Medicine | Admitting: Family Medicine

## 2023-08-28 ENCOUNTER — Ambulatory Visit
Admission: RE | Admit: 2023-08-28 | Discharge: 2023-08-28 | Disposition: A | Discharge status: Home / Self Care | Source: Home / Self Care | Attending: Family Medicine | Admitting: Family Medicine

## 2023-08-28 DIAGNOSIS — M79671 Pain in right foot: Secondary | ICD-10-CM | POA: Insufficient documentation

## 2023-08-30 ENCOUNTER — Encounter: Payer: Self-pay | Admitting: Family Medicine

## 2023-08-31 ENCOUNTER — Telehealth: Payer: Self-pay

## 2023-08-31 ENCOUNTER — Other Ambulatory Visit: Payer: Self-pay | Admitting: Family Medicine

## 2023-08-31 ENCOUNTER — Encounter: Payer: Self-pay | Admitting: Family Medicine

## 2023-08-31 DIAGNOSIS — M79671 Pain in right foot: Secondary | ICD-10-CM

## 2023-08-31 NOTE — Telephone Encounter (Signed)
 Copied from CRM [id number]. Topic: Clinical - Lab/Test Results >> Aug 31, 2023  8:27 AM Yolanda Rodgers wrote: Reason for CRM: Patient is calling for results on the x-ray done on 08/28/2023, I advised patient of the delay the imaging department has. Patient would like to know if there is any way to call and have the results expedited.

## 2023-09-12 ENCOUNTER — Ambulatory Visit

## 2023-09-12 ENCOUNTER — Ambulatory Visit: Admitting: Podiatry

## 2023-09-12 ENCOUNTER — Encounter: Payer: Self-pay | Admitting: Podiatry

## 2023-09-12 DIAGNOSIS — M65971 Unspecified synovitis and tenosynovitis, right ankle and foot: Secondary | ICD-10-CM | POA: Diagnosis not present

## 2023-09-12 DIAGNOSIS — S9031XA Contusion of right foot, initial encounter: Secondary | ICD-10-CM

## 2023-09-12 DIAGNOSIS — M778 Other enthesopathies, not elsewhere classified: Secondary | ICD-10-CM | POA: Diagnosis not present

## 2023-09-12 MED ORDER — BETAMETHASONE SOD PHOS & ACET 6 (3-3) MG/ML IJ SUSP
3.0000 mg | Freq: Once | INTRAMUSCULAR | Status: AC
Start: 2023-09-12 — End: 2023-09-12
  Administered 2023-09-12: 3 mg via INTRA_ARTICULAR

## 2023-09-12 MED ORDER — MELOXICAM 15 MG PO TABS: 15.0000 mg | ORAL_TABLET | Freq: Every day | ORAL | 0 refills | Status: DC

## 2023-09-12 NOTE — Progress Notes (Signed)
   Chief Complaint  Patient presents with   Foot Pain    "My dog stepped on my foot.  It's been hurting since then." N - pain  L - dorsal 2-5 D - over 1 month O - suddenly, gotten better C - sharp pain, tender, sore, swelling A - walking, standing T - hot, ice, soaked Epsom Salt, Voltaren Gel, Dr. Laural Benes gave me steroids for 5 days    HPI: 68 y.o. female presenting today for new complaint of pain and tenderness associated to the right foot.  Ongoing for about 1 month now.  Patient states that she was walking her dog when her dog stepped on her foot.  She has had pain and tenderness ever since.  Ultimately she went to her PCP where x-rays were taken negative for fracture.  She was also prescribed a prednisone pack which helped significantly for about 1 week.  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   Ingrown toenail 03/2021    Past Surgical History:  Procedure Laterality Date   BREAST BIOPSY Right 1999   negative per patient   COLONOSCOPY WITH PROPOFOL N/A 05/25/2023   Procedure: COLONOSCOPY WITH PROPOFOL;  Surgeon: Toney Reil, MD;  Location: [facility] ENDOSCOPY;  Service: Gastroenterology;  Laterality: N/A;   DILATION AND CURETTAGE OF UTERUS     EYE SURGERY  on file   JOINT REPLACEMENT     TOTAL HIP ARTHROPLASTY Right 05/06/2021   TOTAL HIP ARTHROPLASTY Left 05/2021    No Known Allergies   Physical Exam: General: The patient is alert and oriented x3 in no acute distress.  Dermatology: Skin is warm, dry and supple bilateral lower extremities.   Vascular: Palpable pedal pulses bilaterally. Capillary refill within normal limits.  No appreciable edema.  No erythema.  Neurological: Grossly intact via light touch  Musculoskeletal Exam: No pedal deformities noted.  Tenderness with palpation around the lateral aspect of the TMT right foot  DG Foot Complete Right 08/28/2023 FINDINGS: There is  no evidence of fracture or dislocation. There is no evidence of arthropathy or other focal bone abnormality. Soft tissues are unremarkable.   IMPRESSION: Negative.  Assessment/Plan of Care: 1.  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  X-rays reviewed -Injection of 0.5 cc Celestone Soluspan injected around the lateral aspect of the TMT right foot -Prescription for meloxicam 15 mg daily -Immobilization was offered however ultimately declined.  Recommend good supportive tennis shoes and sneakers -RICE -Return to clinic 4 weeks       Felecia Shelling, DPM [facility]  Dr. Felecia Shelling, DPM    [address] [place], [place] [address]                Office [phone]  Fax [phone]

## 2023-09-13 ENCOUNTER — Telehealth: Payer: Self-pay | Admitting: Podiatry

## 2023-09-13 NOTE — Telephone Encounter (Signed)
 Patient is waiting on prescription to be sent to [facility] on [address].

## 2023-09-13 NOTE — Telephone Encounter (Signed)
 Patient saw Dr. Logan Bores on 09/12/23.  Is waiting on prescription to be sent to [facility] on [address].  She thinks the prescription is for Motrin.

## 2023-10-16 ENCOUNTER — Ambulatory Visit
Admission: RE | Admit: 2023-10-16 | Discharge: 2023-10-16 | Disposition: A | Discharge status: Home / Self Care | Source: Ambulatory Visit | Attending: Family Medicine | Admitting: Family Medicine

## 2023-10-16 DIAGNOSIS — Z780 Asymptomatic menopausal state: Secondary | ICD-10-CM | POA: Diagnosis present

## 2023-10-16 DIAGNOSIS — Z1231 Encounter for screening mammogram for malignant neoplasm of breast: Secondary | ICD-10-CM | POA: Diagnosis present

## 2023-10-19 ENCOUNTER — Ambulatory Visit: Payer: Self-pay | Admitting: Family Medicine

## 2023-10-24 ENCOUNTER — Ambulatory Visit (INDEPENDENT_AMBULATORY_CARE_PROVIDER_SITE_OTHER): Admitting: Family Medicine

## 2023-10-24 VITALS — BP 115/75 | HR 69 | Ht 69.0 in | Wt 188.0 lb

## 2023-10-24 DIAGNOSIS — E663 Overweight: Secondary | ICD-10-CM | POA: Diagnosis not present

## 2023-10-24 DIAGNOSIS — Z7189 Other specified counseling: Secondary | ICD-10-CM

## 2023-10-24 DIAGNOSIS — Z136 Encounter for screening for cardiovascular disorders: Secondary | ICD-10-CM | POA: Diagnosis not present

## 2023-10-24 DIAGNOSIS — Z131 Encounter for screening for diabetes mellitus: Secondary | ICD-10-CM | POA: Diagnosis not present

## 2023-10-24 DIAGNOSIS — Z0000 Encounter for general adult medical examination without abnormal findings: Secondary | ICD-10-CM | POA: Diagnosis not present

## 2023-10-24 LAB — BAYER DCA HB A1C WAIVED: HB A1C (BAYER DCA - WAIVED): 5.7 % — ABNORMAL HIGH (ref 4.8–5.6)

## 2023-10-24 NOTE — Assessment & Plan Note (Signed)
A voluntary discussion about advance care planning including the explanation and discussion of advance directives was extensively discussed  with the patient for 3 minutes with patient present.  Explanation about the health care proxy and Living will was reviewed and packet with forms with explanation of how to fill them out was given.  During this discussion, the patient was not able to identify a health care proxy and plans to fill out the paperwork required.  Patient was offered a separate Advance Care Planning visit for further assistance with forms.

## 2023-10-24 NOTE — Progress Notes (Signed)
 Subjective:    Yolanda Rodgers is a 68 y.o. female who presents for a Welcome to Medicare exam.   Cardiac Risk Factors include: advanced age (>27men, >86 women)      Objective:    Today's Vitals   10/24/23 0903  BP: 115/75  Pulse: 69  SpO2: 98%  Weight: 188 lb (85.3 kg)  Height: 5\' 9"  (1.753 m)  PainSc: 0-No pain  Body mass index is 27.76 kg/m.  Medications Outpatient Encounter Medications as of 10/24/2023  Medication Sig   Biotin 10 MG CAPS Take by mouth.   Calcium Carbonate-Vitamin D (CALCIUM-VITAMIN D3 PO) Take 1 tablet by mouth daily.   Cyanocobalamin (VITAMIN B12 PO) Take 1 tablet by mouth daily.   ELDERBERRY PO Take by mouth.   Glucosamine-Chondroitin (GLUCOSAMINE CHONDR COMPLEX PO) Take 1 tablet by mouth daily.   Multiple Vitamin (MULTIVITAMIN) tablet Take 1 tablet by mouth daily.   Multiple Vitamins-Minerals (ZINC PO) Take by mouth. Pt unsure of dose   prednisoLONE acetate (PRED FORTE) 1 % ophthalmic suspension Place 1 drop into the right eye every morning.   SOY ISOFLAVONES MENOPAUSE RLF PO Take by mouth.   meloxicam  (MOBIC ) 15 MG tablet Take 1 tablet (15 mg total) by mouth daily. (Patient not taking: Reported on 10/24/2023)   [DISCONTINUED] Nutritional Supplements (ESTROVEN PO) Take 1 tablet by mouth daily. (Patient not taking: Reported on 10/24/2023)   [DISCONTINUED] predniSONE  (DELTASONE ) 50 MG tablet Take 1 tablet (50 mg total) by mouth daily with breakfast. (Patient not taking: Reported on 10/24/2023)   No facility-administered encounter medications on file as of 10/24/2023.     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   Ingrown toenail 03/2021   Past Surgical History:  Procedure Laterality Date   BREAST BIOPSY Right 1999   negative per patient   COLONOSCOPY WITH PROPOFOL  N/A 05/25/2023   Procedure: COLONOSCOPY WITH PROPOFOL ;  Surgeon: Selena Daily, MD;  Location: [facility] ENDOSCOPY;   Service: Gastroenterology;  Laterality: N/A;   DILATION AND CURETTAGE OF UTERUS     EYE SURGERY  on file   JOINT REPLACEMENT     TOTAL HIP ARTHROPLASTY Right 05/06/2021   TOTAL HIP ARTHROPLASTY Left 05/2021    Family History  Adopted: Yes   Social History   Occupational History   Not on file  Tobacco Use   Smoking status: Never   Smokeless tobacco: Never  Vaping Use   Vaping status: Never Used  Substance and Sexual Activity   Alcohol use: Yes    Comment: on occasion   Drug use: No   Sexual activity: Yes    Birth control/protection: None    Tobacco Counseling Counseling given: Not Answered   Immunizations and Health Maintenance Immunization History  Administered Date(s) Administered   Fluad Trivalent(High Dose 65+) 03/28/2023   Influenza,inj,Quad PF,6+ Mos 05/19/2017, 03/07/2018, 03/22/2021, 03/23/2022   Moderna Sars-Covid-2 Vaccination 10/28/2020, 11/25/2020   Tdap 02/23/2017   Unspecified SARS-COV-2 Vaccination 06/22/2022   There are no preventive care reminders to display for this patient.   Activities of Daily Living    10/24/2023    7:22 AM  In your present state of health, do you have any difficulty performing the following activities:  Hearing? 0  Vision? 0  Difficulty concentrating or making decisions? 0  Walking or climbing stairs? 0  Dressing or bathing? 0  Doing errands, shopping? 0  Preparing Food and eating ? N  Using  the Toilet? N  In the past six months, have you accidently leaked urine? N  Do you have problems with loss of bowel control? N  Managing your Medications? N  Managing your Finances? N  Housekeeping or managing your Housekeeping? N    Advanced Directives: Does Patient Have a Medical Advance Directive?: No Would patient like information on creating a medical advance directive?: Yes (MAU/Ambulatory/Procedural Areas - Information given)  EKG:  normal EKG, normal sinus rhythm, unchanged from previous tracings      Assessment:     This is a routine wellness examination for this patient .  Vision/Hearing screen Hearing Screening  Method: Audiometry   500Hz  1000Hz  2000Hz  4000Hz   Right ear 40 25 40 40  Left ear 40 25 40 40   Vision Screening   Right eye Left eye Both eyes  Without correction     With correction 20/30 20/100 20/30     Goals   None    Depression Screen    10/24/2023    8:58 AM 08/08/2023   11:40 AM 03/28/2023    2:30 PM 03/23/2022    9:11 AM  PHQ 2/9 Scores  PHQ - 2 Score 0 1 0 0  PHQ- 9 Score 2 1  2      Fall Risk    10/24/2023    7:22 AM  Fall Risk   Falls in the past year? 0    Cognitive Function:        10/24/2023    9:00 AM  6CIT Screen  What Year? 0 points  What month? 0 points  What time? 0 points  Count back from 20 0 points  Months in reverse 0 points  Repeat phrase 0 points  Total Score 0 points    Patient Care Team: Solomon Dupre, DO as PCP - General (Family Medicine)     Plan:    I have personally reviewed and noted the following in the patient's chart:   Medical and social history Use of alcohol, tobacco or illicit drugs  Current medications and supplements including opioid prescriptions. Patient is not currently taking opioid prescriptions. Functional ability and status Nutritional status Physical activity Advanced directives List of other physicians Hospitalizations, surgeries, and ER visits in previous 12 months Vitals Screenings to include cognitive, depression, and falls Referrals and appointments  In addition, I have reviewed and discussed with patient certain preventive protocols, quality metrics, and best practice recommendations. A written personalized care plan for preventive services as well as general preventive health recommendations were provided to patient.     Terre Ferri, DO 10/24/2023

## 2023-10-24 NOTE — Patient Instructions (Addendum)
 Preventative Services:  AAA screening: N/A Health Risk Assessment and Personalized Prevention Plan: Done today Bone Mass Measurements: up to date Breast Cancer Screening: up to date CVD Screening: done today Cervical Cancer Screening: N/A Colon Cancer Screening: up to date Depression Screening: done today Diabetes Screening: done today Glaucoma Screening: see your eye doctor Hepatitis B vaccine: N/A Hepatitis C screening: up to date HIV Screening: up to date Flu Vaccine: up to date Lung cancer Screening: N/A Obesity Screening: done today Pneumonia Vaccines: will consider STI Screening: N/A   [name] , Thank you for taking time to come for your Medicare Wellness Visit. I appreciate your ongoing commitment to your health goals. Please review the following plan we discussed and let me know if I can assist you in the future.   These are the goals we discussed:  Goals   None     This is a list of the screening recommended for you and due dates:  Health Maintenance  Topic Date Due   COVID-19 Vaccine (4 - 2024-25 season) 11/09/2023*   Zoster (Shingles) Vaccine (1 of 2) 01/24/2024*   Pneumonia Vaccine (1 of 1 - PCV) 10/23/2024*   Flu Shot  01/05/2024   Mammogram  10/15/2024   Medicare Annual Wellness Visit  10/23/2024   Colon Cancer Screening  05/24/2026   DTaP/Tdap/Td vaccine (2 - Td or Tdap) 02/24/2027   DEXA scan (bone density measurement)  Completed   Hepatitis C Screening  Completed   HPV Vaccine  Aged Out   Meningitis B Vaccine  Aged Out   Cologuard (Stool DNA test)  Discontinued  *Topic was postponed. The date shown is not the original due date.

## 2023-10-24 NOTE — Progress Notes (Signed)
 BP 115/75 (BP Location: Left Arm, Patient Position: Sitting, Cuff Size: Normal)   Pulse 69   Ht 5\' 9"  (1.753 m)   Wt 188 lb (85.3 kg)   LMP  (LMP Unknown)   SpO2 98%   BMI 27.76 kg/m    Subjective:    Patient ID: Yolanda Rodgers, female    DOB: 11-24-1955, 68 y.o.   MRN: [id number]  HPI: Yolanda Rodgers is a 68 y.o. female presenting on 10/24/2023 for comprehensive medical examination. Current medical complaints include:none  She currently lives with: husband Menopausal Symptoms: yes  Functional Status Survey: Is the patient deaf or have difficulty hearing?: (Patient-Rptd) No Does the patient have difficulty seeing, even when wearing glasses/contacts?: (Patient-Rptd) No Does the patient have difficulty concentrating, remembering, or making decisions?: (Patient-Rptd) No Does the patient have difficulty walking or climbing stairs?: (Patient-Rptd) No Does the patient have difficulty dressing or bathing?: (Patient-Rptd) No Does the patient have difficulty doing errands alone such as visiting a doctor's office or shopping?: (Patient-Rptd) No     10/24/2023    7:22 AM 08/08/2023   11:40 AM 03/28/2023    2:30 PM 03/23/2022    9:11 AM 01/25/2021    9:33 AM  Fall Risk   Falls in the past year? 0 0 0 0   Number falls in past yr:  0 0 0 0  Injury with Fall?  0 0 0 0  Risk for fall due to :  No Fall Risks No Fall Risks No Fall Risks Impaired balance/gait  Follow up  Falls evaluation completed Falls evaluation completed Falls evaluation completed Falls evaluation completed    Depression Screen    10/24/2023    8:58 AM 08/08/2023   11:40 AM 03/28/2023    2:30 PM 03/23/2022    9:11 AM 03/22/2021    3:39 PM  Depression screen PHQ 2/9  Decreased Interest 0 1 0 0 1  Down, Depressed, Hopeless 0 0 0 0 0  PHQ - 2 Score 0 1 0 0 1  Altered sleeping 1 0  1 1  Tired, decreased energy 1 0  1 1  Change in appetite 0 0  0 0  Feeling bad or failure about yourself  0 0  0 0  Trouble concentrating  0 0  0 0  Moving slowly or fidgety/restless 0 0  0 0  Suicidal thoughts 0 0  0 0  PHQ-9 Score 2 1  2 3   Difficult doing work/chores Somewhat difficult Somewhat difficult      Advanced Directives Does patient have a HCPOA?    no If yes, name and contact information:  Does patient have a living will or MOST form?  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   Ingrown toenail 03/2021    Surgical History:  Past Surgical History:  Procedure Laterality Date   BREAST BIOPSY Right 1999   negative per patient   COLONOSCOPY WITH PROPOFOL  N/A 05/25/2023   Procedure: COLONOSCOPY WITH PROPOFOL ;  Surgeon: Selena Daily, MD;  Location: [facility] ENDOSCOPY;  Service: Gastroenterology;  Laterality: N/A;   DILATION AND CURETTAGE OF UTERUS     EYE SURGERY  on file   JOINT REPLACEMENT     TOTAL HIP ARTHROPLASTY Right 05/06/2021   TOTAL HIP ARTHROPLASTY Left 05/2021    Medications:  Current Outpatient Medications on File Prior to Visit  Medication Sig   Biotin 10  MG CAPS Take by mouth.   Calcium Carbonate-Vitamin D (CALCIUM-VITAMIN D3 PO) Take 1 tablet by mouth daily.   Cyanocobalamin (VITAMIN B12 PO) Take 1 tablet by mouth daily.   ELDERBERRY PO Take by mouth.   Glucosamine-Chondroitin (GLUCOSAMINE CHONDR COMPLEX PO) Take 1 tablet by mouth daily.   Multiple Vitamin (MULTIVITAMIN) tablet Take 1 tablet by mouth daily.   Multiple Vitamins-Minerals (ZINC PO) Take by mouth. Pt unsure of dose   prednisoLONE acetate (PRED FORTE) 1 % ophthalmic suspension Place 1 drop into the right eye every morning.   SOY ISOFLAVONES MENOPAUSE RLF PO Take by mouth.   meloxicam  (MOBIC ) 15 MG tablet Take 1 tablet (15 mg total) by mouth daily. (Patient not taking: Reported on 10/24/2023)   No current facility-administered medications on file prior to visit.    Allergies:  No Known Allergies  Social History:  Social History    Socioeconomic History   Marital status: Married    Spouse name: Not on file   Number of children: Not on file   Years of education: Not on file   Highest education level: Bachelor's degree (e.g., BA, AB, BS)  Occupational History   Not on file  Tobacco Use   Smoking status: Never   Smokeless tobacco: Never  Vaping Use   Vaping status: Never Used  Substance and Sexual Activity   Alcohol use: Yes    Comment: on occasion   Drug use: No   Sexual activity: Yes    Birth control/protection: None  Other Topics Concern   Not on file  Social History Narrative   Not on file   Social Drivers of Health   Financial Resource Strain: Low Risk  (10/24/2023)   Overall Financial Resource Strain (CARDIA)    Difficulty of Paying Living Expenses: Not very hard  Food Insecurity: No Food Insecurity (10/24/2023)   Hunger Vital Sign    Worried About Running Out of Food in the Last Year: Never true    Ran Out of Food in the Last Year: Never true  Transportation Needs: No Transportation Needs (10/24/2023)   PRAPARE - Administrator, Civil Service (Medical): No    Lack of Transportation (Non-Medical): No  Physical Activity: Insufficiently Active (10/24/2023)   Exercise Vital Sign    Days of Exercise per Week: 3 days    Minutes of Exercise per Session: 30 min  Stress: No Stress Concern Present (10/24/2023)   [employer] of Occupational Health - Occupational Stress Questionnaire    Feeling of Stress : Only a little  Social Connections: Socially Integrated (10/24/2023)   Social Connection and Isolation Panel [NHANES]    Frequency of Communication with Friends and Family: Twice a week    Frequency of Social Gatherings with Friends and Family: Twice a week    Attends Religious Services: More than 4 times per year    Active Member of [employer] or Organizations: Yes    Attends Engineer, structural: More than 4 times per year    Marital Status: Married  Catering manager Violence:  Not At Risk (10/24/2023)   Humiliation, Afraid, Rape, and Kick questionnaire    Fear of Current or Ex-Partner: No    Emotionally Abused: No    Physically Abused: No    Sexually Abused: No   Social History   Tobacco Use  Smoking Status Never  Smokeless Tobacco Never   Social History   Substance and Sexual Activity  Alcohol Use Yes   Comment: on occasion  Family History:  Family History  Adopted: Yes    Past medical history, surgical history, medications, allergies, family history and social history reviewed with patient today and changes made to appropriate areas of the chart.   Review of Systems  Constitutional:  Positive for diaphoresis. Negative for chills, fever, malaise/fatigue and weight loss.  HENT: Negative.    Eyes:  Positive for blurred vision. Negative for double vision, photophobia, pain, discharge and redness.  Respiratory: Negative.    Cardiovascular: Negative.   Gastrointestinal: Negative.   Genitourinary:  Positive for frequency. Negative for dysuria, flank pain, hematuria and urgency.  Musculoskeletal: Negative.   Skin: Negative.   Neurological: Negative.   Endo/Heme/Allergies: Negative.   Psychiatric/Behavioral: Negative.      All other ROS negative except what is listed above and in the HPI.      Objective:    BP 115/75 (BP Location: Left Arm, Patient Position: Sitting, Cuff Size: Normal)   Pulse 69   Ht 5\' 9"  (1.753 m)   Wt 188 lb (85.3 kg)   LMP  (LMP Unknown)   SpO2 98%   BMI 27.76 kg/m   Wt Readings from Last 3 Encounters:  10/24/23 188 lb (85.3 kg)  08/25/23 186 lb 6.4 oz (84.6 kg)  08/08/23 187 lb (84.8 kg)    Hearing Screening  Method: Audiometry   500Hz  1000Hz  2000Hz  4000Hz   Right ear 40 25 40 40  Left ear 40 25 40 40   Vision Screening   Right eye Left eye Both eyes  Without correction     With correction 20/30 20/100 20/30    Physical Exam Vitals and nursing note reviewed.  Constitutional:      General: She is not  in acute distress.    Appearance: Normal appearance. She is not ill-appearing, toxic-appearing or diaphoretic.  HENT:     Head: Normocephalic and atraumatic.     Right Ear: Tympanic membrane, ear canal and external ear normal. There is no impacted cerumen.     Left Ear: Tympanic membrane, ear canal and external ear normal. There is no impacted cerumen.     Nose: Nose normal. No congestion or rhinorrhea.     Mouth/Throat:     Mouth: Mucous membranes are moist.     Pharynx: Oropharynx is clear. No oropharyngeal exudate or posterior oropharyngeal erythema.  Eyes:     General: No scleral icterus.       Right eye: No discharge.        Left eye: No discharge.     Extraocular Movements: Extraocular movements intact.     Conjunctiva/sclera: Conjunctivae normal.     Pupils: Pupils are equal, round, and reactive to light.  Neck:     Vascular: No carotid bruit.  Cardiovascular:     Rate and Rhythm: Normal rate and regular rhythm.     Pulses: Normal pulses.     Heart sounds: No murmur heard.    No friction rub. No gallop.  Pulmonary:     Effort: Pulmonary effort is normal. No respiratory distress.     Breath sounds: Normal breath sounds. No stridor. No wheezing, rhonchi or rales.  Chest:     Chest wall: No tenderness.  Abdominal:     General: Abdomen is flat. Bowel sounds are normal. There is no distension.     Palpations: Abdomen is soft. There is no mass.     Tenderness: There is no abdominal tenderness. There is no right CVA tenderness, left CVA tenderness, guarding or rebound.  Hernia: No hernia is present.  Genitourinary:    Comments: Breast and pelvic exams deferred with shared decision making Musculoskeletal:        General: No swelling, tenderness, deformity or signs of injury. Normal range of motion.     Cervical back: Normal range of motion and neck supple. No rigidity. No muscular tenderness.     Right lower leg: No edema.     Left lower leg: No edema.  Lymphadenopathy:      Cervical: No cervical adenopathy.  Skin:    General: Skin is warm and dry.     Capillary Refill: Capillary refill takes less than 2 seconds.     Coloration: Skin is not jaundiced or pale.     Findings: No bruising, erythema, lesion or rash.  Neurological:     General: No focal deficit present.     Mental Status: She is alert and oriented to person, place, and time. Mental status is at baseline.     Cranial Nerves: No cranial nerve deficit.     Sensory: No sensory deficit.     Motor: No weakness.     Coordination: Coordination normal.     Gait: Gait normal.     Deep Tendon Reflexes: Reflexes normal.  Psychiatric:        Mood and Affect: Mood normal.        Behavior: Behavior normal.        Thought Content: Thought content normal.        Judgment: Judgment normal.        10/24/2023    9:00 AM  6CIT Screen  What Year? 0 points  What month? 0 points  What time? 0 points  Count back from 20 0 points  Months in reverse 0 points  Repeat phrase 0 points  Total Score 0 points    Results for orders placed or performed in visit on 10/24/23  Bayer DCA Hb A1c Waived   Collection Time: 10/24/23 10:00 AM  Result Value Ref Range   HB A1C (BAYER DCA - WAIVED) 5.7 (H) 4.8 - 5.6 %      Assessment & Plan:   Problem List Items Addressed This Visit   None Visit Diagnoses       Welcome to Medicare preventive visit    -  Primary   Vaccines up to date. Screening labs checked today. Pap N/A. Mammo, DEXA and colonoscopy up to date. Continue diet and exercise. Call with any concerns.     Screening for cardiovascular condition       Labs drawn today. Await results.   Relevant Orders   Lipid Panel w/o Chol/HDL Ratio   EKG 12-Lead (Completed)     Overweight       Labs drawn today. Await results.   Relevant Orders   CBC with Differential/Platelet   Comprehensive metabolic panel with GFR   TSH     Screening for diabetes mellitus       Labs drawn today. Await results.   Relevant Orders    Bayer DCA Hb A1c Waived (Completed)        Preventative Services:  AAA screening: N/A Health Risk Assessment and Personalized Prevention Plan: Done today Bone Mass Measurements: up to date Breast Cancer Screening: up to date CVD Screening: done today Cervical Cancer Screening: N/A Colon Cancer Screening: up to date Depression Screening: done today Diabetes Screening: done today Glaucoma Screening: see your eye doctor Hepatitis B vaccine: N/A Hepatitis C screening: up to date HIV Screening:  up to date Flu Vaccine: up to date Lung cancer Screening: N/A Obesity Screening: done today Pneumonia Vaccines: will consider STI Screening: N/A  Follow up plan: Return in about 1 year (around 10/23/2024) for physical.   LABORATORY TESTING:  - Pap smear: not applicable  IMMUNIZATIONS:   - Tdap: Tetanus vaccination status reviewed: last tetanus booster within 10 years. - Influenza: Up to date - Prevnar: Will consider - Zostavax vaccine: Given elsewhere  SCREENING: -Mammogram: Up to date  - Colonoscopy: Up to date  - Bone Density: Up to date  -Hearing Test: Ordered today    PATIENT COUNSELING:   Advised to take 1 mg of folate supplement per day if capable of pregnancy.   Sexuality: Discussed sexually transmitted diseases, partner selection, use of condoms, avoidance of unintended pregnancy  and contraceptive alternatives.   Advised to avoid cigarette smoking.  I discussed with the patient that most people either abstain from alcohol or drink within safe limits (<=14/week and <=4 drinks/occasion for males, <=7/weeks and <= 3 drinks/occasion for females) and that the risk for alcohol disorders and other health effects rises proportionally with the number of drinks per week and how often a drinker exceeds daily limits.  Discussed cessation/primary prevention of drug use and availability of treatment for abuse.   Diet: Encouraged to adjust caloric intake to maintain  or achieve  ideal body weight, to reduce intake of dietary saturated fat and total fat, to limit sodium intake by avoiding high sodium foods and not adding table salt, and to maintain adequate dietary potassium and calcium preferably from fresh fruits, vegetables, and low-fat dairy products.    stressed the importance of regular exercise  Injury prevention: Discussed safety belts, safety helmets, smoke detector, smoking near bedding or upholstery.   Dental health: Discussed importance of regular tooth brushing, flossing, and dental visits.    NEXT PREVENTATIVE PHYSICAL DUE IN 1 YEAR. Return in about 1 year (around 10/23/2024) for physical.

## 2023-10-25 LAB — COMPREHENSIVE METABOLIC PANEL WITH GFR
ALT: 11 IU/L (ref 0–32)
AST: 18 IU/L (ref 0–40)
Albumin: 4.3 g/dL (ref 3.9–4.9)
Alkaline Phosphatase: 73 IU/L (ref 44–121)
BUN/Creatinine Ratio: 21 (ref 12–28)
BUN: 16 mg/dL (ref 8–27)
Bilirubin Total: 0.3 mg/dL (ref 0.0–1.2)
CO2: 24 mmol/L (ref 20–29)
Calcium: 9.5 mg/dL (ref 8.7–10.3)
Chloride: 102 mmol/L (ref 96–106)
Creatinine, Ser: 0.75 mg/dL (ref 0.57–1.00)
Globulin, Total: 2.8 g/dL (ref 1.5–4.5)
Glucose: 87 mg/dL (ref 70–99)
Potassium: 4.4 mmol/L (ref 3.5–5.2)
Sodium: 140 mmol/L (ref 134–144)
Total Protein: 7.1 g/dL (ref 6.0–8.5)
eGFR: 88 mL/min/{1.73_m2} (ref >59)

## 2023-10-25 LAB — CBC WITH DIFFERENTIAL/PLATELET
Basophils Absolute: 0.1 10*3/uL (ref 0.0–0.2)
Basos: 1 %
EOS (ABSOLUTE): 0.2 10*3/uL (ref 0.0–0.4)
Eos: 4 %
Hematocrit: 37.6 % (ref 34.0–46.6)
Hemoglobin: 12 g/dL (ref 11.1–15.9)
Immature Grans (Abs): 0 10*3/uL (ref 0.0–0.1)
Immature Granulocytes: 0 %
Lymphocytes Absolute: 1.9 10*3/uL (ref 0.7–3.1)
Lymphs: 36 %
MCH: 28.9 pg (ref 26.6–33.0)
MCHC: 31.9 g/dL (ref 31.5–35.7)
MCV: 91 fL (ref 79–97)
Monocytes Absolute: 0.4 10*3/uL (ref 0.1–0.9)
Monocytes: 9 %
Neutrophils Absolute: 2.6 10*3/uL (ref 1.4–7.0)
Neutrophils: 50 %
Platelets: 358 10*3/uL (ref 150–450)
RBC: 4.15 x10E6/uL (ref 3.77–5.28)
RDW: 13.3 % (ref 11.7–15.4)
WBC: 5.2 10*3/uL (ref 3.4–10.8)

## 2023-10-25 LAB — LIPID PANEL W/O CHOL/HDL RATIO
Cholesterol, Total: 272 mg/dL — ABNORMAL HIGH (ref 100–199)
HDL: 63 mg/dL (ref >39)
LDL Chol Calc (NIH): 198 mg/dL — ABNORMAL HIGH (ref 0–99)
Triglycerides: 68 mg/dL (ref 0–149)
VLDL Cholesterol Cal: 11 mg/dL (ref 5–40)

## 2023-10-25 LAB — TSH: TSH: 2.1 u[IU]/mL (ref 0.450–4.500)

## 2023-10-27 ENCOUNTER — Ambulatory Visit: Payer: Self-pay | Admitting: Family Medicine

## 2023-10-27 DIAGNOSIS — E7800 Pure hypercholesterolemia, unspecified: Secondary | ICD-10-CM | POA: Insufficient documentation

## 2023-10-31 NOTE — Progress Notes (Signed)
 Called patient left message for patient to call office and schedule 6 month follow up appt.

## 2024-03-04 ENCOUNTER — Encounter: Payer: Self-pay | Admitting: Internal Medicine

## 2024-03-04 ENCOUNTER — Ambulatory Visit (INDEPENDENT_AMBULATORY_CARE_PROVIDER_SITE_OTHER): Admitting: Internal Medicine

## 2024-03-04 ENCOUNTER — Ambulatory Visit: Payer: Self-pay

## 2024-03-04 VITALS — BP 122/60 | HR 100 | Temp 99.5°F | Ht 69.0 in | Wt 183.2 lb

## 2024-03-04 DIAGNOSIS — K1120 Sialoadenitis, unspecified: Secondary | ICD-10-CM

## 2024-03-04 MED ORDER — AMOXICILLIN-POT CLAVULANATE 875-125 MG PO TABS: 1.0000 | ORAL_TABLET | Freq: Two times a day (BID) | ORAL | 0 refills | Status: AC

## 2024-03-04 NOTE — Telephone Encounter (Signed)
 FYI Only or Action Required?: FYI only for provider.  Patient was last seen in primary care on 10/24/2023 by Vicci Bouchard P, DO.  Called Nurse Triage reporting Mass.  Symptoms began several days ago.  Interventions attempted: Nothing.  Symptoms are: gradually worsening.  Triage Disposition: See Physician Within 24 Hours  Patient/caregiver understands and will follow disposition?:  Reason for Disposition  [1] Swelling is painful to touch AND [2] no fever  Answer Assessment - Initial Assessment Questions Reports swelling under right ear that is worse when she eats something spicy. Denies recent illness or fever. Scheduled for same day at [facility] [place].  1. APPEARANCE of SWELLING: What does it look like?     Unable to visualize  2. SIZE: How large is the swelling? (e.g., inches, cm; or compare to size of pinhead, tip of pen, eraser, coin, pea, grape, ping pong ball)      50 cent piece  3. LOCATION: Where is the swelling located?     Under right ear  4. ONSET: When did the swelling start?     03/03/24  5. COLOR: What color is it? Is there more than one color?     Unable to visualize  6. PAIN: Is there any pain? If Yes, ask: How bad is the pain? (Scale 1-10; or mild, moderate, severe)       Tender to the touch  7. ITCH: Does it itch? If Yes, ask: How bad is the itch?      Denies  Protocols used: Skin Lump or Localized Swelling-A-AH Copied from CRM #[id number]. Topic: Clinical - Red Word Triage >> Mar 04, 2024 12:34 PM Debby BROCKS wrote: Red Word that prompted transfer to Nurse Triage: swelling under her ear, thought it was an insect bite. Its currently tender to touch When she eats something salty or spicy it hurts

## 2024-03-04 NOTE — Telephone Encounter (Signed)
 Noted

## 2024-03-04 NOTE — Progress Notes (Signed)
 Date:  03/04/2024   Name:  Yolanda Rodgers   DOB:  [birth year]   MRN:  [id number]   Chief Complaint: Mass (Patient noticed a large mass in her right neck located under her right ear that started 1 day ago. She said it is tender to touch. Hurts more when she eats cold, salty, or spicy foods. She did do yard work 2 days ago but unsure if she was bitten by any bugs at that time. Low grade fever but no other complaints at this time.)  HPI Swelling under right jaw - acute onset overnight.  Tender to touch and has some mild shooting pain with spicy, salty or sour foods.  No injury but was working outside yesterday.  Review of Systems  Constitutional:  Negative for diaphoresis, fatigue and fever.  HENT:  Positive for facial swelling. Negative for mouth sores and trouble swallowing.   Respiratory:  Negative for chest tightness and shortness of breath.   Gastrointestinal:  Negative for abdominal pain.     Lab Results  Component Value Date   NA 140 10/24/2023   K 4.4 10/24/2023   CO2 24 10/24/2023   GLUCOSE 87 10/24/2023   BUN 16 10/24/2023   CREATININE 0.75 10/24/2023   CALCIUM 9.5 10/24/2023   EGFR 88 10/24/2023   GFRNONAA 97 03/18/2020   Lab Results  Component Value Date   CHOL 272 (H) 10/24/2023   HDL 63 10/24/2023   LDLCALC 198 (H) 10/24/2023   TRIG 68 10/24/2023   Lab Results  Component Value Date   TSH 2.100 10/24/2023   Lab Results  Component Value Date   HGBA1C 5.7 (H) 10/24/2023   Lab Results  Component Value Date   WBC 5.2 10/24/2023   HGB 12.0 10/24/2023   HCT 37.6 10/24/2023   MCV 91 10/24/2023   PLT 358 10/24/2023   Lab Results  Component Value Date   ALT 11 10/24/2023   AST 18 10/24/2023   ALKPHOS 73 10/24/2023   BILITOT 0.3 10/24/2023   No results found for: MARIEN BOLLS, VD25OH   Patient Active Problem List   Diagnosis Date Noted   Pure hypercholesterolemia 10/27/2023   Advance directive discussed with patient 10/24/2023    Positive colorectal cancer screening using Cologuard test 05/25/2023   Cecal polyp 05/25/2023   Polyp of sigmoid colon 05/25/2023   Onychomycosis 01/25/2021   Cataract 02/17/2017    No Known Allergies  Past Surgical History:  Procedure Laterality Date   BREAST BIOPSY Right 1999   negative per patient   COLONOSCOPY WITH PROPOFOL  N/A 05/25/2023   Procedure: COLONOSCOPY WITH PROPOFOL ;  Surgeon: Unk Corinn Skiff, MD;  Location: [facility] ENDOSCOPY;  Service: Gastroenterology;  Laterality: N/A;   DILATION AND CURETTAGE OF UTERUS     EYE SURGERY  on file   JOINT REPLACEMENT     TOTAL HIP ARTHROPLASTY Right 05/06/2021   TOTAL HIP ARTHROPLASTY Left 05/2021    Social History   Tobacco Use   Smoking status: Never   Smokeless tobacco: Never  Vaping Use   Vaping status: Never Used  Substance Use Topics   Alcohol use: Yes    Comment: on occasion   Drug use: No     Medication list has been reviewed and updated.  Current Meds  Medication Sig   amoxicillin-clavulanate (AUGMENTIN) 875-125 MG tablet Take 1 tablet by mouth 2 (two) times daily for 10 days.   Biotin 10 MG CAPS Take by mouth.   Calcium Carbonate-Vitamin D (CALCIUM-VITAMIN D3  PO) Take 1 tablet by mouth daily.   Cyanocobalamin (VITAMIN B12 PO) Take 1 tablet by mouth daily.   ELDERBERRY PO Take by mouth.   Glucosamine-Chondroitin (GLUCOSAMINE CHONDR COMPLEX PO) Take 1 tablet by mouth daily.   Multiple Vitamin (MULTIVITAMIN) tablet Take 1 tablet by mouth daily.   Multiple Vitamins-Minerals (ZINC PO) Take by mouth. Pt unsure of dose   prednisoLONE acetate (PRED FORTE) 1 % ophthalmic suspension Place 1 drop into the right eye every morning.   SOY ISOFLAVONES MENOPAUSE RLF PO Take by mouth.       03/04/2024    2:15 PM 10/24/2023    9:01 AM 08/08/2023   11:41 AM 03/28/2023    2:30 PM  GAD 7 : Generalized Anxiety Score  Nervous, Anxious, on Edge 0 0 0   Control/stop worrying 0 0 0 0  Worry too much - different things 0 0 0 1   Trouble relaxing 0 1 0 1  Restless 0 0 0 0  Easily annoyed or irritable 0 1 0 0  Afraid - awful might happen 0 0 0 0  Total GAD 7 Score 0 2 0   Anxiety Difficulty Not difficult at all Somewhat difficult Not difficult at all Somewhat difficult       03/04/2024    2:14 PM 10/24/2023    8:58 AM 08/08/2023   11:40 AM  Depression screen PHQ 2/9  Decreased Interest 0 0 1  Down, Depressed, Hopeless 0 0 0  PHQ - 2 Score 0 0 1  Altered sleeping 1 1 0  Tired, decreased energy 1 1 0  Change in appetite 0 0 0  Feeling bad or failure about yourself  0 0 0  Trouble concentrating 0 0 0  Moving slowly or fidgety/restless 0 0 0  Suicidal thoughts 0 0 0  PHQ-9 Score 2 2 1   Difficult doing work/chores Not difficult at all Somewhat difficult Somewhat difficult    BP Readings from Last 3 Encounters:  03/04/24 122/60  10/24/23 115/75  08/25/23 98/63    Physical Exam Vitals and nursing note reviewed.  Constitutional:      General: She is not in acute distress.    Appearance: Normal appearance. She is well-developed.  HENT:     Head: Normocephalic and atraumatic.     Salivary Glands: Right salivary gland is diffusely enlarged and tender.     Mouth/Throat:     Mouth: Mucous membranes are moist.     Tongue: No lesions.  Cardiovascular:     Rate and Rhythm: Normal rate and regular rhythm.  Pulmonary:     Effort: Pulmonary effort is normal. No respiratory distress.     Breath sounds: No wheezing or rhonchi.  Musculoskeletal:     Cervical back: Normal range of motion. Tenderness (right submandibular salivary gland) present.  Skin:    General: Skin is warm and dry.     Findings: No rash.  Neurological:     General: No focal deficit present.     Mental Status: She is alert and oriented to person, place, and time.  Psychiatric:        Mood and Affect: Mood normal.        Behavior: Behavior normal.     Wt Readings from Last 3 Encounters:  03/04/24 183 lb 3.2 oz (83.1 kg)  10/24/23 188  lb (85.3 kg)  08/25/23 186 lb 6.4 oz (84.6 kg)    BP 122/60   Pulse 100   Temp 99.5 F (37.5  C) (Oral)   Ht 5' 9 (1.753 m)   Wt 183 lb 3.2 oz (83.1 kg)   LMP  (LMP Unknown)   SpO2 98%   BMI 27.05 kg/m   Assessment and Plan:  Problem List Items Addressed This Visit   None Visit Diagnoses       Sialoadenitis    -  Primary   push fluids and use lemon drops or other sour foods/candy augmentin to cover for possible infection if no improvement in 1-2 days will need ENT evaluation   Relevant Medications   amoxicillin-clavulanate (AUGMENTIN) 875-125 MG tablet       No follow-ups on file.    Leita HILARIO Adie, MD [facility] [place]

## 2024-03-06 ENCOUNTER — Ambulatory Visit: Payer: Self-pay

## 2024-03-06 NOTE — Telephone Encounter (Signed)
 FYI Only or Action Required?: see office notes from earlier encounter  Patient was last seen in primary care on 03/04/2024 by Yolanda Leita DEL, MD.  Called Nurse Triage reporting neck swelling and pain  seeking referral to ENT.  Symptoms began Saturday.  Interventions attempted: Prescription medications: antibiotics and lemon drops and lemon water .  Symptoms are: unchanged.  Triage Disposition:   Call PCP with information Patient/caregiver understands and will follow disposition?: yes        Copied from CRM #[id number]. Topic: Clinical - Red Word Triage >> Mar 06, 2024 12:09 PM Yolanda Rodgers wrote: Red Word that prompted transfer to Nurse Triage: patient is calling about swelling under her ear. Tender to touch Reason for Disposition  [1] MILD face swelling (e.g., puffiness) AND [2] persists > 3 days  Answer Assessment - Initial Assessment Questions 1. ONSET: When did the swelling start? (e.g., minutes, hours, days)     Sunday  2. LOCATION: What part of the face is swollen? (e.g., cheek, entire face, jaw joint area, under jaw)     Underneath right ear  3. SEVERITY: How swollen is it?     Visible swelling  4. ITCHING: Is there any itching? If Yes, ask: How much?   (Scale 1-10; mild, moderate or severe)     N/a 5. PAIN: Is the swelling painful to touch? If Yes, ask: How painful is it?   (Scale 0-10; mild, moderate or severe)     4/10 6. FEVER: Do you have a fever? If Yes, ask: What is it, how was it measured, and when did it start?      no 7. CAUSE: What do you think is causing the face swelling?     N/a 8. NEW MEDICINES: Have there been any new medicines started recently?     Abx   10. OTHER SYMPTOMS: Do you have any other symptoms? (e.g., leg swelling, toothache)       Still hurting - no improvement per notes ENT eval  Protocols used: Face Swelling-A-AH

## 2024-03-07 NOTE — Telephone Encounter (Signed)
 Appointment will be needed. Forwarding to admin team for an assist.

## 2024-03-07 NOTE — Telephone Encounter (Signed)
 Copied from CRM [id number]. Topic: Referral - Request for Referral >> Mar 07, 2024  4:31 PM Santiya F wrote: Did the patient discuss referral with their provider in the last year? Yes  (If Yes - send message)  Appointment offered? No  Type of order/referral and detailed reason for visit: ENT (Ear nose throat)  Preference of office, provider, location: n/a  If referral order, have you been seen by this specialty before? No (If Yes, this issue or another issue? When? Where?  Can we respond through MyChart? Yes

## 2024-03-08 NOTE — Telephone Encounter (Signed)
 Patient contacted and appointment scheduled for Monday, 8/6 @ 8:20am

## 2024-03-11 ENCOUNTER — Ambulatory Visit (INDEPENDENT_AMBULATORY_CARE_PROVIDER_SITE_OTHER): Admitting: Family Medicine

## 2024-03-11 ENCOUNTER — Encounter: Payer: Self-pay | Admitting: Family Medicine

## 2024-03-11 VITALS — BP 123/76 | HR 92 | Temp 98.2°F | Ht 69.0 in | Wt 184.5 lb

## 2024-03-11 DIAGNOSIS — K1120 Sialoadenitis, unspecified: Secondary | ICD-10-CM | POA: Diagnosis not present

## 2024-03-11 DIAGNOSIS — Z23 Encounter for immunization: Secondary | ICD-10-CM | POA: Diagnosis not present

## 2024-03-11 NOTE — Progress Notes (Signed)
 BP 123/76   Pulse 92   Temp 98.2 F (36.8 C) (Oral)   Ht 5' 9 (1.753 m)   Wt 184 lb 8 oz (83.7 kg)   LMP  (LMP Unknown)   SpO2 97%   BMI 27.25 kg/m    Subjective:    Patient ID: Yolanda Rodgers, female    DOB: [birth year], 68 y.o.   MRN: [id number]  HPI: Yolanda Rodgers is a 68 y.o. female  Chief Complaint  Patient presents with   Follow-up    Right ear, to it better now   75% better from when she saw Dr. Justus. Still finishing her antibiotics. No fevers. No chills. Generally feeling better, but not quite there.   Relevant past medical, surgical, family and social history reviewed and updated as indicated. Interim medical history since our last visit reviewed. Allergies and medications reviewed and updated.  Review of Systems  Constitutional: Negative.   HENT:  Positive for facial swelling. Negative for congestion, dental problem, drooling, ear discharge, ear pain, hearing loss, mouth sores, nosebleeds, postnasal drip, rhinorrhea, sinus pressure, sinus pain, sneezing, sore throat, tinnitus, trouble swallowing and voice change.   Respiratory: Negative.    Cardiovascular: Negative.   Musculoskeletal: Negative.   Psychiatric/Behavioral: Negative.      Per HPI unless specifically indicated above     Objective:    BP 123/76   Pulse 92   Temp 98.2 F (36.8 C) (Oral)   Ht 5' 9 (1.753 m)   Wt 184 lb 8 oz (83.7 kg)   LMP  (LMP Unknown)   SpO2 97%   BMI 27.25 kg/m   Wt Readings from Last 3 Encounters:  03/11/24 184 lb 8 oz (83.7 kg)  03/04/24 183 lb 3.2 oz (83.1 kg)  10/24/23 188 lb (85.3 kg)    Physical Exam Vitals and nursing note reviewed.  Constitutional:      General: She is not in acute distress.    Appearance: Normal appearance. She is well-developed.  HENT:     Head: Normocephalic and atraumatic.     Right Ear: Hearing and external ear normal.     Left Ear: Hearing and external ear normal.     Nose: Nose normal.     Mouth/Throat:     Mouth: Mucous  membranes are moist.     Pharynx: Oropharynx is clear.  Eyes:     General: Lids are normal. No scleral icterus.       Right eye: No discharge.        Left eye: No discharge.     Conjunctiva/sclera: Conjunctivae normal.  Neck:     Comments: + submandibular swelling on R side of her face with mild tenderness Pulmonary:     Effort: Pulmonary effort is normal. No respiratory distress.  Musculoskeletal:        General: Normal range of motion.  Skin:    Coloration: Skin is not jaundiced or pale.     Findings: No bruising, erythema, lesion or rash.  Neurological:     General: No focal deficit present.     Mental Status: She is alert and oriented to person, place, and time. Mental status is at baseline.  Psychiatric:        Mood and Affect: Mood normal.        Speech: Speech normal.        Behavior: Behavior normal.        Thought Content: Thought content normal.        Judgment: Judgment  normal.     Results for orders placed or performed in visit on 10/24/23  Bayer DCA Hb A1c Waived   Collection Time: 10/24/23 10:00 AM  Result Value Ref Range   HB A1C (BAYER DCA - WAIVED) 5.7 (H) 4.8 - 5.6 %  CBC with Differential/Platelet   Collection Time: 10/24/23 10:01 AM  Result Value Ref Range   WBC 5.2 3.4 - 10.8 x10E3/uL   RBC 4.15 3.77 - 5.28 x10E6/uL   Hemoglobin 12.0 11.1 - 15.9 g/dL   Hematocrit 62.3 65.9 - 46.6 %   MCV 91 79 - 97 fL   MCH 28.9 26.6 - 33.0 pg   MCHC 31.9 31.5 - 35.7 g/dL   RDW 86.6 88.2 - 84.5 %   Platelets 358 150 - 450 x10E3/uL   Neutrophils 50 Not Estab. %   Lymphs 36 Not Estab. %   Monocytes 9 Not Estab. %   Eos 4 Not Estab. %   Basos 1 Not Estab. %   Neutrophils Absolute 2.6 1.4 - 7.0 x10E3/uL   Lymphocytes Absolute 1.9 0.7 - 3.1 x10E3/uL   Monocytes Absolute 0.4 0.1 - 0.9 x10E3/uL   EOS (ABSOLUTE) 0.2 0.0 - 0.4 x10E3/uL   Basophils Absolute 0.1 0.0 - 0.2 x10E3/uL   Immature Granulocytes 0 Not Estab. %   Immature Grans (Abs) 0.0 0.0 - 0.1 x10E3/uL   Comprehensive metabolic panel with GFR   Collection Time: 10/24/23 10:01 AM  Result Value Ref Range   Glucose 87 70 - 99 mg/dL   BUN 16 8 - 27 mg/dL   Creatinine, Ser 9.24 0.57 - 1.00 mg/dL   eGFR 88 >40 fO/fpw/8.26   BUN/Creatinine Ratio 21 12 - 28   Sodium 140 134 - 144 mmol/L   Potassium 4.4 3.5 - 5.2 mmol/L   Chloride 102 96 - 106 mmol/L   CO2 24 20 - 29 mmol/L   Calcium 9.5 8.7 - 10.3 mg/dL   Total Protein 7.1 6.0 - 8.5 g/dL   Albumin 4.3 3.9 - 4.9 g/dL   Globulin, Total 2.8 1.5 - 4.5 g/dL   Bilirubin Total 0.3 0.0 - 1.2 mg/dL   Alkaline Phosphatase 73 44 - 121 IU/L   AST 18 0 - 40 IU/L   ALT 11 0 - 32 IU/L  Lipid Panel w/o Chol/HDL Ratio   Collection Time: 10/24/23 10:01 AM  Result Value Ref Range   Cholesterol, Total 272 (H) 100 - 199 mg/dL   Triglycerides 68 0 - 149 mg/dL   HDL 63 >60 mg/dL   VLDL Cholesterol Cal 11 5 - 40 mg/dL   LDL Chol Calc (NIH) 801 (H) 0 - 99 mg/dL   LDL CALC COMMENT: Comment   TSH   Collection Time: 10/24/23 10:01 AM  Result Value Ref Range   TSH 2.100 0.450 - 4.500 uIU/mL      Assessment & Plan:   Problem List Items Addressed This Visit   None Visit Diagnoses       Sialoadenitis    -  Primary   75% better. Finish antibiotics. Call with any concerns. If not gone by Friday will refer to ENT.     Needs flu shot       Flu shot given today.   Relevant Orders   Flu vaccine HIGH DOSE PF(Fluzone Trivalent) (Completed)        Follow up plan: Return for As scheduled.

## 2024-03-28 ENCOUNTER — Encounter: Payer: Self-pay | Admitting: Family Medicine

## 2024-04-10 ENCOUNTER — Ambulatory Visit: Admitting: Family Medicine

## 2024-05-14 ENCOUNTER — Ambulatory Visit: Admitting: Family Medicine

## 2024-05-14 ENCOUNTER — Encounter: Payer: Self-pay | Admitting: Family Medicine

## 2024-05-14 VITALS — BP 117/74 | HR 80 | Temp 98.2°F | Ht 69.0 in | Wt 181.8 lb

## 2024-05-14 DIAGNOSIS — E7800 Pure hypercholesterolemia, unspecified: Secondary | ICD-10-CM

## 2024-05-14 NOTE — Assessment & Plan Note (Signed)
 Rechecking labs today. Await results. Treat as needed.

## 2024-05-14 NOTE — Progress Notes (Signed)
 BP 117/74   Pulse 80   Temp 98.2 F (36.8 C) (Oral)   Ht 5' 9 (1.753 m)   Wt 181 lb 12.8 oz (82.5 kg)   LMP  (LMP Unknown)   SpO2 97%   BMI 26.85 kg/m    Subjective:    Patient ID: Yolanda Rodgers, female    DOB: Oct 21, 1955, 68 y.o.   MRN: [id number]  HPI: Yolanda Rodgers is a 68 y.o. female  Chief Complaint  Patient presents with   Hyperlipidemia   HYPERLIPIDEMIA Hyperlipidemia status: stable Satisfied with current treatment?  yes Side effects:  N/A Past cholesterol meds: none Supplements: fish oil Aspirin:  no The 10-year ASCVD risk score (Arnett DK, et al., 2019) is: 9.1%   Values used to calculate the score:     Age: 80 years     Clincally relevant sex: Female     Is Non-Hispanic African American: Yes     Diabetic: No     Tobacco smoker: No     Systolic Blood Pressure: 117 mmHg     Is BP treated: No     HDL Cholesterol: 63 mg/dL     Total Cholesterol: 272 mg/dL Chest pain:  no Coronary artery disease:  no  Relevant past medical, surgical, family and social history reviewed and updated as indicated. Interim medical history since our last visit reviewed. Allergies and medications reviewed and updated.  Review of Systems  Constitutional: Negative.   HENT:  Positive for congestion. Negative for dental problem, drooling, ear discharge, ear pain, facial swelling, hearing loss, mouth sores, nosebleeds, postnasal drip, rhinorrhea, sinus pressure, sinus pain, sneezing, sore throat, tinnitus, trouble swallowing and voice change.   Respiratory: Negative.    Cardiovascular: Negative.   Neurological: Negative.   Psychiatric/Behavioral: Negative.      Per HPI unless specifically indicated above     Objective:    BP 117/74   Pulse 80   Temp 98.2 F (36.8 C) (Oral)   Ht 5' 9 (1.753 m)   Wt 181 lb 12.8 oz (82.5 kg)   LMP  (LMP Unknown)   SpO2 97%   BMI 26.85 kg/m   Wt Readings from Last 3 Encounters:  05/14/24 181 lb 12.8 oz (82.5 kg)  03/11/24 184 lb 8 oz  (83.7 kg)  03/04/24 183 lb 3.2 oz (83.1 kg)    Physical Exam Vitals and nursing note reviewed.  Constitutional:      General: She is not in acute distress.    Appearance: Normal appearance. She is not ill-appearing, toxic-appearing or diaphoretic.  HENT:     Head: Normocephalic and atraumatic.     Right Ear: External ear normal.     Left Ear: External ear normal.     Nose: Nose normal.     Mouth/Throat:     Mouth: Mucous membranes are moist.     Pharynx: Oropharynx is clear.  Eyes:     General: No scleral icterus.       Right eye: No discharge.        Left eye: No discharge.     Extraocular Movements: Extraocular movements intact.     Conjunctiva/sclera: Conjunctivae normal.     Pupils: Pupils are equal, round, and reactive to light.  Cardiovascular:     Rate and Rhythm: Normal rate and regular rhythm.     Pulses: Normal pulses.     Heart sounds: Normal heart sounds. No murmur heard.    No friction rub. No gallop.  Pulmonary:  Effort: Pulmonary effort is normal. No respiratory distress.     Breath sounds: Normal breath sounds. No stridor. No wheezing, rhonchi or rales.  Chest:     Chest wall: No tenderness.  Musculoskeletal:        General: Normal range of motion.     Cervical back: Normal range of motion and neck supple.  Skin:    General: Skin is warm and dry.     Capillary Refill: Capillary refill takes less than 2 seconds.     Coloration: Skin is not jaundiced or pale.     Findings: No bruising, erythema, lesion or rash.  Neurological:     General: No focal deficit present.     Mental Status: She is alert and oriented to person, place, and time. Mental status is at baseline.  Psychiatric:        Mood and Affect: Mood normal.        Behavior: Behavior normal.        Thought Content: Thought content normal.        Judgment: Judgment normal.     Results for orders placed or performed in visit on 10/24/23  Bayer DCA Hb A1c Waived   Collection Time: 10/24/23  10:00 AM  Result Value Ref Range   HB A1C (BAYER DCA - WAIVED) 5.7 (H) 4.8 - 5.6 %  CBC with Differential/Platelet   Collection Time: 10/24/23 10:01 AM  Result Value Ref Range   WBC 5.2 3.4 - 10.8 x10E3/uL   RBC 4.15 3.77 - 5.28 x10E6/uL   Hemoglobin 12.0 11.1 - 15.9 g/dL   Hematocrit 62.3 65.9 - 46.6 %   MCV 91 79 - 97 fL   MCH 28.9 26.6 - 33.0 pg   MCHC 31.9 31.5 - 35.7 g/dL   RDW 86.6 88.2 - 84.5 %   Platelets 358 150 - 450 x10E3/uL   Neutrophils 50 Not Estab. %   Lymphs 36 Not Estab. %   Monocytes 9 Not Estab. %   Eos 4 Not Estab. %   Basos 1 Not Estab. %   Neutrophils Absolute 2.6 1.4 - 7.0 x10E3/uL   Lymphocytes Absolute 1.9 0.7 - 3.1 x10E3/uL   Monocytes Absolute 0.4 0.1 - 0.9 x10E3/uL   EOS (ABSOLUTE) 0.2 0.0 - 0.4 x10E3/uL   Basophils Absolute 0.1 0.0 - 0.2 x10E3/uL   Immature Granulocytes 0 Not Estab. %   Immature Grans (Abs) 0.0 0.0 - 0.1 x10E3/uL  Comprehensive metabolic panel with GFR   Collection Time: 10/24/23 10:01 AM  Result Value Ref Range   Glucose 87 70 - 99 mg/dL   BUN 16 8 - 27 mg/dL   Creatinine, Ser 9.24 0.57 - 1.00 mg/dL   eGFR 88 >40 fO/fpw/8.26   BUN/Creatinine Ratio 21 12 - 28   Sodium 140 134 - 144 mmol/L   Potassium 4.4 3.5 - 5.2 mmol/L   Chloride 102 96 - 106 mmol/L   CO2 24 20 - 29 mmol/L   Calcium 9.5 8.7 - 10.3 mg/dL   Total Protein 7.1 6.0 - 8.5 g/dL   Albumin 4.3 3.9 - 4.9 g/dL   Globulin, Total 2.8 1.5 - 4.5 g/dL   Bilirubin Total 0.3 0.0 - 1.2 mg/dL   Alkaline Phosphatase 73 44 - 121 IU/L   AST 18 0 - 40 IU/L   ALT 11 0 - 32 IU/L  Lipid Panel w/o Chol/HDL Ratio   Collection Time: 10/24/23 10:01 AM  Result Value Ref Range   Cholesterol, Total 272 (H) 100 -  199 mg/dL   Triglycerides 68 0 - 149 mg/dL   HDL 63 >60 mg/dL   VLDL Cholesterol Cal 11 5 - 40 mg/dL   LDL Chol Calc (NIH) 801 (H) 0 - 99 mg/dL   LDL CALC COMMENT: Comment   TSH   Collection Time: 10/24/23 10:01 AM  Result Value Ref Range   TSH 2.100 0.450 - 4.500  uIU/mL      Assessment & Plan:   Problem List Items Addressed This Visit       Other   Pure hypercholesterolemia - Primary   Rechecking labs today. Await results. Treat as needed.       Relevant Orders   Comprehensive metabolic panel with GFR   Lipid Panel w/o Chol/HDL Ratio     Follow up plan: Return in about 6 months (around 11/12/2024) for physical.

## 2024-05-15 LAB — COMPREHENSIVE METABOLIC PANEL WITH GFR
ALT: 15 IU/L (ref 0–32)
AST: 14 IU/L (ref 0–40)
Albumin: 4.3 g/dL (ref 3.9–4.9)
Alkaline Phosphatase: 72 IU/L (ref 49–135)
BUN/Creatinine Ratio: 19 (ref 12–28)
BUN: 14 mg/dL (ref 8–27)
Bilirubin Total: 0.3 mg/dL (ref 0.0–1.2)
CO2: 26 mmol/L (ref 20–29)
Calcium: 9.4 mg/dL (ref 8.7–10.3)
Chloride: 101 mmol/L (ref 96–106)
Creatinine, Ser: 0.74 mg/dL (ref 0.57–1.00)
Globulin, Total: 2.9 g/dL (ref 1.5–4.5)
Glucose: 70 mg/dL (ref 70–99)
Potassium: 3.9 mmol/L (ref 3.5–5.2)
Sodium: 138 mmol/L (ref 134–144)
Total Protein: 7.2 g/dL (ref 6.0–8.5)
eGFR: 89 mL/min/1.73 (ref >59)

## 2024-05-15 LAB — LIPID PANEL W/O CHOL/HDL RATIO
Cholesterol, Total: 222 mg/dL — ABNORMAL HIGH (ref 100–199)
HDL: 54 mg/dL (ref >39)
LDL Chol Calc (NIH): 149 mg/dL — ABNORMAL HIGH (ref 0–99)
Triglycerides: 105 mg/dL (ref 0–149)
VLDL Cholesterol Cal: 19 mg/dL (ref 5–40)

## 2024-05-16 ENCOUNTER — Ambulatory Visit: Payer: Self-pay | Admitting: Family Medicine

## 2024-10-29 ENCOUNTER — Encounter: Admitting: Family Medicine
